# Patient Record
Sex: Male | Born: 1970 | Race: White | Hispanic: No | Marital: Married | Smoking: Never smoker
Health system: Southern US, Community
[De-identification: ages and names within clinical notes are randomized; demographics above are authoritative.]

## PROBLEM LIST (undated history)

## (undated) DIAGNOSIS — K219 Gastro-esophageal reflux disease without esophagitis: Secondary | ICD-10-CM

## (undated) HISTORY — DX: Gastro-esophageal reflux disease without esophagitis: K21.9

---

## 2000-06-25 ENCOUNTER — Emergency Department (HOSPITAL_COMMUNITY): Admission: EM | Admit: 2000-06-25 | Discharge: 2000-06-25 | Payer: Self-pay | Admitting: Emergency Medicine

## 2000-06-25 ENCOUNTER — Encounter: Payer: Self-pay | Admitting: Emergency Medicine

## 2003-02-16 ENCOUNTER — Emergency Department (HOSPITAL_COMMUNITY): Admission: EM | Admit: 2003-02-16 | Discharge: 2003-02-16 | Payer: Self-pay | Admitting: Emergency Medicine

## 2004-03-14 ENCOUNTER — Emergency Department (HOSPITAL_COMMUNITY): Admission: EM | Admit: 2004-03-14 | Discharge: 2004-03-14 | Payer: Self-pay | Admitting: Emergency Medicine

## 2004-06-13 ENCOUNTER — Emergency Department (HOSPITAL_COMMUNITY): Admission: EM | Admit: 2004-06-13 | Discharge: 2004-06-13 | Payer: Self-pay | Admitting: Emergency Medicine

## 2004-11-03 ENCOUNTER — Emergency Department (HOSPITAL_COMMUNITY): Admission: EM | Admit: 2004-11-03 | Discharge: 2004-11-04 | Payer: Self-pay | Admitting: Emergency Medicine

## 2017-06-26 DIAGNOSIS — J0100 Acute maxillary sinusitis, unspecified: Secondary | ICD-10-CM | POA: Diagnosis not present

## 2017-06-30 DIAGNOSIS — R0789 Other chest pain: Secondary | ICD-10-CM | POA: Diagnosis not present

## 2017-06-30 DIAGNOSIS — J060 Acute laryngopharyngitis: Secondary | ICD-10-CM | POA: Diagnosis not present

## 2017-06-30 DIAGNOSIS — R5383 Other fatigue: Secondary | ICD-10-CM | POA: Diagnosis not present

## 2017-09-30 DIAGNOSIS — R5383 Other fatigue: Secondary | ICD-10-CM | POA: Diagnosis not present

## 2017-09-30 DIAGNOSIS — E298 Other testicular dysfunction: Secondary | ICD-10-CM | POA: Diagnosis not present

## 2017-11-16 DIAGNOSIS — H8112 Benign paroxysmal vertigo, left ear: Secondary | ICD-10-CM | POA: Diagnosis not present

## 2017-11-16 DIAGNOSIS — R5383 Other fatigue: Secondary | ICD-10-CM | POA: Diagnosis not present

## 2017-12-20 ENCOUNTER — Emergency Department (HOSPITAL_COMMUNITY): Payer: BLUE CROSS/BLUE SHIELD

## 2017-12-20 ENCOUNTER — Emergency Department (HOSPITAL_COMMUNITY)
Admission: EM | Admit: 2017-12-20 | Discharge: 2017-12-20 | Disposition: A | Discharge status: Home / Self Care | Payer: BLUE CROSS/BLUE SHIELD | Attending: Emergency Medicine | Admitting: Emergency Medicine

## 2017-12-20 ENCOUNTER — Encounter (HOSPITAL_COMMUNITY): Payer: Self-pay | Admitting: Emergency Medicine

## 2017-12-20 DIAGNOSIS — R079 Chest pain, unspecified: Secondary | ICD-10-CM | POA: Diagnosis not present

## 2017-12-20 DIAGNOSIS — Z79899 Other long term (current) drug therapy: Secondary | ICD-10-CM | POA: Diagnosis not present

## 2017-12-20 DIAGNOSIS — R410 Disorientation, unspecified: Secondary | ICD-10-CM | POA: Diagnosis not present

## 2017-12-20 DIAGNOSIS — R42 Dizziness and giddiness: Secondary | ICD-10-CM

## 2017-12-20 DIAGNOSIS — R0602 Shortness of breath: Secondary | ICD-10-CM | POA: Diagnosis not present

## 2017-12-20 LAB — BASIC METABOLIC PANEL
Anion gap: 7 (ref 5–15)
BUN: 10 mg/dL (ref 6–20)
CHLORIDE: 103 mmol/L (ref 101–111)
CO2: 29 mmol/L (ref 22–32)
Calcium: 9.1 mg/dL (ref 8.9–10.3)
Creatinine, Ser: 0.97 mg/dL (ref 0.61–1.24)
GFR calc Af Amer: >60 mL/min (ref >60)
GFR calc non Af Amer: >60 mL/min (ref >60)
GLUCOSE: 112 mg/dL — AB (ref 65–99)
POTASSIUM: 4.3 mmol/L (ref 3.5–5.1)
Sodium: 139 mmol/L (ref 135–145)

## 2017-12-20 LAB — I-STAT TROPONIN, ED: Troponin i, poc: 0 ng/mL (ref 0.00–0.08)

## 2017-12-20 LAB — CBG MONITORING, ED: GLUCOSE-CAPILLARY: 104 mg/dL — AB (ref 65–99)

## 2017-12-20 LAB — DIFFERENTIAL
Abs Immature Granulocytes: 0 10*3/uL (ref 0.0–0.1)
BASOS ABS: 0.1 10*3/uL (ref 0.0–0.1)
BASOS PCT: 1 %
Eosinophils Absolute: 0.3 10*3/uL (ref 0.0–0.7)
Eosinophils Relative: 9 %
IMMATURE GRANULOCYTES: 0 %
Lymphocytes Relative: 33 %
Lymphs Abs: 1.3 10*3/uL (ref 0.7–4.0)
MONO ABS: 0.3 10*3/uL (ref 0.1–1.0)
Monocytes Relative: 8 %
NEUTROS ABS: 1.9 10*3/uL (ref 1.7–7.7)
NEUTROS PCT: 49 %

## 2017-12-20 LAB — APTT: aPTT: 28 seconds (ref 24–36)

## 2017-12-20 LAB — CBC
HEMATOCRIT: 43.3 % (ref 39.0–52.0)
Hemoglobin: 14.5 g/dL (ref 13.0–17.0)
MCH: 29.4 pg (ref 26.0–34.0)
MCHC: 33.5 g/dL (ref 30.0–36.0)
MCV: 87.7 fL (ref 78.0–100.0)
Platelets: 286 10*3/uL (ref 150–400)
RBC: 4.94 MIL/uL (ref 4.22–5.81)
RDW: 12.6 % (ref 11.5–15.5)
WBC: 3.9 10*3/uL — AB (ref 4.0–10.5)

## 2017-12-20 LAB — PROTIME-INR
INR: 1.03
Prothrombin Time: 13.4 seconds (ref 11.4–15.2)

## 2017-12-20 MED ORDER — LORAZEPAM 2 MG/ML IJ SOLN
1.0000 mg | Freq: Once | INTRAMUSCULAR | Status: AC
  Administered 2017-12-20: 1 mg via INTRAVENOUS
  Filled 2017-12-20: qty 1

## 2017-12-20 MED ORDER — GADOBENATE DIMEGLUMINE 529 MG/ML IV SOLN
17.0000 mL | Freq: Once | INTRAVENOUS | Status: AC
  Administered 2017-12-20: 17 mL via INTRAVENOUS

## 2017-12-20 MED ORDER — ACETAMINOPHEN 325 MG PO TABS
650.0000 mg | ORAL_TABLET | Freq: Once | ORAL | Status: AC
  Administered 2017-12-20: 650 mg via ORAL
  Filled 2017-12-20: qty 2

## 2017-12-20 NOTE — ED Triage Notes (Addendum)
Patient presents to ED for assessment of three weeks of dizziness, previous diagnosis of vertigo.  Starting two days ago he began to have midsternal chest pain with radiation to the right chest.  Patient c/o SOB, c/o intermittent nausea, c/o "feeling out of my head" and having trouble focusing.  Patient told to come to ED for TIA workup by his PCP.  Pt states increased thirst as well, hx of DM in his family.

## 2017-12-20 NOTE — ED Notes (Signed)
Patient transported to MRI 

## 2017-12-20 NOTE — ED Provider Notes (Signed)
Patient reports that a week ago he developed dizziness meaning sensation of room spinning which lasted approximately 4 days and resolved gradually.  Symptoms worse with changing positions.  He saw his primary care physician and was prescribed medicine which helped his dizziness.  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  He feels much improved presently.  He denies headache.  Denies speaking.  Denies other complaint.  On exam alert Glasgow Coma Score 15 HEENT exam no facial asymmetry neck is supple no bruit lungs clear to auscultation heart regular rate and rhythm.  Neurologic Glasgow Coma Score 15 cranial nerves II through XII grossly intact.  Gait normal motor strength 5/5 overall Romberg normal pronator drift normal finger-to-nose normal.  Discussed case with Dr.Linnzen, neurologist who does not feel that  symptoms suggest TIA, stroke or seizure rather transient confusional state.  He suggest MRI /MRA of brain and neck.  If okay patient can be discharged with follow-up to PCP   Doug SouJacubowitz, Blayke Pinera, MD 12/20/17 1649

## 2017-12-20 NOTE — ED Provider Notes (Signed)
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  He was disoriented for a few hours, but resolved and is back to baseline today.  Patient denies any dizziness, but he reports he was diagnosed with peripheral vertigo recently.  He does not feel like this was related.  Case was discussed with Dr. Otelia LimesLindzen with neurology who recommends MRI and discharge home to PCP, if negative.  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  Dr. Rosalia Hammersay discussed this with Dr. Amada JupiterKirkpatrick, neurologist, who advised this was fairly insignificant, but follow-up to PCP.  Patient complaining of headache after MRI, given Tylenol before discharge.  Return precautions discussed.  Patient and wife understand and agree with plan.  Patient vitals stable throughout ED course and discharged in satisfactory condition.   Emi HolesLaw, Shamieka Gullo M, PA-C 12/20/17 1942    Margarita Grizzleay, Danielle, MD 12/21/17 [phone]

## 2017-12-20 NOTE — ED Provider Notes (Signed)
[facility] EMERGENCY DEPARTMENT Provider Note   CSN: [id number] Arrival date & time: 12/20/17  1116     History   Chief Complaint Chief Complaint  Patient presents with  . Dizziness    HPI Brad Zuniga is a 47 y.o. male.  HPI   Brad Zuniga is a 47 year old male with no significant past medical history who presents to the emergency department for evaluation of transient episode of confusion yesterday.  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  This was atypical, as he has been operating heavy machinery for years. He felt uncoordinated, states that he was conscious the entire time but felt very foggy and out of it.  States that this feeling lasted for several hours, but has since improved.  He now feels baseline.  He denies focal numbness or weakness during this episode, denies difficulty speaking or facial droop.  He states that his bilateral hands were shaky.  No loss of bowel or bladder control or tongue biting.  He also reports that 3 weeks ago he felt the sensation of room spinning and went to his PCP who told him he had vertigo.  Those symptoms have resolved and he has not felt room spinning sensation in many days now.  He states that last week he had sharp intermittent right-sided chest pain which lasted for a day, that was 4 days ago and he has not had any chest pain since.  He reports that he sometimes has the feeling that it is difficult to take a deep breath, but yawning helps.  He currently denies fevers, chills, cough, chest pain, abdominal pain, nausea/vomiting, diaphoresis, lightheadedness, dizziness, syncope.  He is asking if he may have had a stroke.  History reviewed. No pertinent past medical history.  There are no active problems to display for this patient.   History reviewed. No pertinent surgical history.      Home  Medications    Prior to Admission medications   Medication Sig Start Date End Date Taking? Authorizing Provider  alum & mag hydroxide-simeth (MAALOX/MYLANTA) 200-200-20 MG/5ML suspension Take 30 mLs by mouth every 6 (six) hours as needed for indigestion or heartburn.   Yes [provider]  esomeprazole (NEXIUM) 40 MG capsule Take 40 mg by mouth daily at 12 noon.   Yes [provider]  ibuprofen (ADVIL,MOTRIN) 200 MG tablet Take 600 mg by mouth every 6 (six) hours as needed for mild pain.   Yes [provider]  Multiple Vitamins-Minerals (MULTIVITAMIN WITH MINERALS) tablet Take 1 tablet by mouth daily.   Yes [provider]    Family History History reviewed. No pertinent family history.  Social History Social History   Tobacco Use  . Smoking status: Never Smoker  . Smokeless tobacco: Never Used  Substance Use Topics  . Alcohol use: Not Currently  . Drug use: Never     Allergies   Patient has no known allergies.   Review of Systems Review of Systems  Constitutional: Negative for chills and fever.  HENT: Negative for congestion and sore throat.   Respiratory: Negative for shortness of breath (difficulty taking a full breath).   Cardiovascular: Negative for chest pain.  Gastrointestinal: Negative for abdominal pain, nausea and vomiting.  Genitourinary: Negative for difficulty urinating and frequency.  Musculoskeletal: Negative for back pain and gait problem.  Skin: Negative for  rash.  Neurological: Negative for dizziness (room spinning sensation a few weeks ago, denies this currently), seizures, syncope, speech difficulty, weakness, numbness and headaches.  Psychiatric/Behavioral: Positive for confusion (yesterday). Negative for agitation.     Physical Exam Updated Vital Signs BP 128/88   Pulse 63   Temp 98.1 F (36.7 C) (Oral)   Resp 17   SpO2 100%   Physical Exam  Constitutional: He is oriented to person, place, and time. He  appears well-developed and well-nourished. No distress.  Sitting at bedside in no apparent distress, nontoxic-appearing.  HENT:  Head: Normocephalic and atraumatic.  Mouth/Throat: Oropharynx is clear and moist. No oropharyngeal exudate.  Mucous memories moist.  Eyes: Pupils are equal, round, and reactive to light. Conjunctivae are normal. Right eye exhibits no discharge. Left eye exhibits no discharge.  Neck: Normal range of motion. Neck supple.  Cardiovascular: Normal rate, regular rhythm and intact distal pulses.  No murmur heard. Pulmonary/Chest: Effort normal and breath sounds normal. No stridor. No respiratory distress. He has no wheezes. He has no rales.  Abdominal: Soft. There is no tenderness.  Musculoskeletal: Normal range of motion.  Neurological: He is alert and oriented to person, place, and time. Coordination normal.  Mental Status:  Alert, oriented, thought content appropriate, able to give a coherent history. Speech fluent without evidence of aphasia. Able to follow 2 step commands without difficulty.  Cranial Nerves:  II:  Peripheral visual fields grossly normal, pupils equal, round, reactive to light III,IV, VI: ptosis not present, extra-ocular motions intact bilaterally  V,VII: smile symmetric, facial light touch sensation equal VIII: hearing grossly normal to voice  X: uvula elevates symmetrically  XI: bilateral shoulder shrug symmetric and strong XII: midline tongue extension without fassiculations Motor:  Normal tone. 5/5 in upper and lower extremities bilaterally including strong and equal grip strength and dorsiflexion/plantar flexion Sensory: Pinprick and light touch normal in all extremities.  Cerebellar: normal finger-to-nose with bilateral upper extremities Gait: normal gait and balance CV: distal pulses palpable throughout   Skin: Skin is warm and dry. He is not diaphoretic.  Psychiatric: He has a normal mood and affect. His behavior is normal.  Nursing  note and vitals reviewed.    ED Treatments / Results  Labs (all labs ordered are listed, but only abnormal results are displayed) Labs Reviewed  BASIC METABOLIC PANEL - Abnormal; Notable for the following components:      Result Value   Glucose, Bld 112 (*)    All other components within normal limits  CBC - Abnormal; Notable for the following components:   WBC 3.9 (*)    All other components within normal limits  CBG MONITORING, ED - Abnormal; Notable for the following components:   Glucose-Capillary 104 (*)    All other components within normal limits  PROTIME-INR  APTT  DIFFERENTIAL  I-STAT TROPONIN, ED    EKG EKG Interpretation  Date/Time:  Sunday December 20 2017 11:27:03 EDT Ventricular Rate:  80 PR Interval:  148 QRS Duration: 92 QT Interval:  394 QTC Calculation: 454 R Axis:   81 Text Interpretation:  Normal sinus rhythm Possible Left atrial enlargement Borderline ECG SINCE LAST TRACING HEART RATE HAS INCREASED Confirmed by Doug SouJacubowitz, Sam [phone]([id number]) on 12/20/2017 3:37:07 PM   Radiology Dg Chest 2 View  Result Date: 12/20/2017 CLINICAL DATA:  Shortness of breath and chest pain EXAM: CHEST - 2 VIEW COMPARISON:  March 02, 2009 FINDINGS: There is no edema or consolidation. The heart size and pulmonary vascularity are normal.  No adenopathy. No bone lesions. No pneumothorax. IMPRESSION: No edema or consolidation. Electronically Signed   By: Bretta Bang III M.D.   On: 12/20/2017 12:21   Ct Head Wo Contrast  Result Date: 12/20/2017 CLINICAL DATA:  47 year old male with abrupt onset vertigo and cognitive difficulty 3 weeks ago with persistent symptoms. EXAM: CT HEAD WITHOUT CONTRAST TECHNIQUE: Contiguous axial images were obtained from the base of the skull through the vertex without intravenous contrast. COMPARISON:  Orbit CT 07/21/2003. FINDINGS: Brain: Normal cerebral volume. No midline shift, ventriculomegaly, mass effect, evidence of mass lesion, intracranial hemorrhage  or evidence of cortically based acute infarction. Gray-white matter differentiation is within normal limits throughout the brain. Vascular: No suspicious intracranial vascular hyperdensity. Skull: Negative. Sinuses/Orbits: Minor paranasal sinus mucosal thickening. The bilateral tympanic cavities and mastoids are clear. Other: Visualized orbit soft tissues are within normal limits. Visualized scalp soft tissues are within normal limits. IMPRESSION: Normal noncontrast Head CT. Electronically Signed   By: Odessa Fleming M.D.   On: 12/20/2017 14:45    Procedures Procedures (including critical care time)  Medications Ordered in ED Medications  LORazepam (ATIVAN) injection 1 mg (has no administration in time range)     Initial Impression / Assessment and Plan / ED Course  I have reviewed the triage vital signs and the nursing notes.  Pertinent labs & imaging results that were available during my care of the patient were reviewed by me and considered in my medical decision making (see chart for details).    Presents to the emergency department for evaluation of transient episode of confusion yesterday.  No loss of consciousness, tongue biting or loss of bowel or bladder control.  He has no prior history of seizures.  No focal numbness, weakness or speech difficulty.  On exam today, no neurological deficits.  Reports that he feels baseline.  Extensive work-up including labs, CT scan and cardiac work-up ordered prior to me seeing this patient.  Radiology results reviewed.  CBC unremarkable.  BMP without any major electrolyte abnormalities, kidney function within normal limits.  I-STAT troponin negative and EKG nonischemic with patient reporting he has not had chest pain in four days, doubt ACS.  CT head without acute abnormality.  Chest x-ray without acute abnormality.  Discussed this patient with Neurologist Dr. Otelia Limes who doubts TIA or atypical seizure.  Recommend MRI/MRA brain for further evaluation.  If  negative patient can be discharged home with instructions to follow-up with his PCP.  This was a shared visit with Dr. Rennis Chris who also saw the patient and agrees with this plan.  Signout given at shift change to PA Glenford Bayley who will disposition the patient once MRI returns.  Final Clinical Impressions(s) / ED Diagnoses   Final diagnoses:  None    ED Discharge Orders    None       Kellie Shropshire, PA-C 12/20/17 1700    Doug Sou, MD 12/20/17 [phone]

## 2017-12-20 NOTE — Discharge Instructions (Addendum)
Please follow-up with your doctor for recheck this week.  The radiologist recommended a repeat CTA of the circle of Willis to reassess the minute aneurysm found on MRI.  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  Please let your doctor know about this however.  Please return the emergency department if you develop any new or worsening symptoms.

## 2017-12-23 DIAGNOSIS — R5383 Other fatigue: Secondary | ICD-10-CM | POA: Diagnosis not present

## 2017-12-23 DIAGNOSIS — R42 Dizziness and giddiness: Secondary | ICD-10-CM | POA: Diagnosis not present

## 2018-01-01 DIAGNOSIS — G459 Transient cerebral ischemic attack, unspecified: Secondary | ICD-10-CM | POA: Diagnosis not present

## 2018-01-01 DIAGNOSIS — I517 Cardiomegaly: Secondary | ICD-10-CM | POA: Diagnosis not present

## 2018-01-01 DIAGNOSIS — I6523 Occlusion and stenosis of bilateral carotid arteries: Secondary | ICD-10-CM | POA: Diagnosis not present

## 2018-01-01 DIAGNOSIS — R42 Dizziness and giddiness: Secondary | ICD-10-CM | POA: Diagnosis not present

## 2018-01-12 DIAGNOSIS — A770 Spotted fever due to Rickettsia rickettsii: Secondary | ICD-10-CM | POA: Insufficient documentation

## 2018-01-12 DIAGNOSIS — R42 Dizziness and giddiness: Secondary | ICD-10-CM | POA: Insufficient documentation

## 2018-01-12 DIAGNOSIS — I671 Cerebral aneurysm, nonruptured: Secondary | ICD-10-CM | POA: Insufficient documentation

## 2018-01-12 DIAGNOSIS — R51 Headache: Secondary | ICD-10-CM | POA: Diagnosis not present

## 2018-01-12 DIAGNOSIS — R519 Headache, unspecified: Secondary | ICD-10-CM | POA: Insufficient documentation

## 2018-01-25 ENCOUNTER — Encounter: Payer: Self-pay | Admitting: Neurology

## 2018-01-27 ENCOUNTER — Ambulatory Visit (INDEPENDENT_AMBULATORY_CARE_PROVIDER_SITE_OTHER): Payer: BLUE CROSS/BLUE SHIELD | Admitting: Neurology

## 2018-01-27 ENCOUNTER — Encounter: Payer: Self-pay | Admitting: Neurology

## 2018-01-27 VITALS — BP 102/80 | HR 54 | Ht 74.0 in | Wt 188.0 lb

## 2018-01-27 DIAGNOSIS — G454 Transient global amnesia: Secondary | ICD-10-CM | POA: Diagnosis not present

## 2018-01-27 DIAGNOSIS — H8110 Benign paroxysmal vertigo, unspecified ear: Secondary | ICD-10-CM | POA: Diagnosis not present

## 2018-01-27 DIAGNOSIS — I671 Cerebral aneurysm, nonruptured: Secondary | ICD-10-CM | POA: Diagnosis not present

## 2018-01-27 NOTE — Patient Instructions (Signed)
Although it is not a classic presentation, you may have had transient global amnesia.  It is benign.  Just to be sure, I want to check an EEG to look for any abnormalities that suggest increased risk for seizures.  The aneurysm is small.  Recommend repeat imaging (CTA of head) in one year.  This can be followed up by your PCP.  If you experience new headaches or double vision, then imaging may need to be performed sooner.  No follow up needed unless the EEG shows something concerning or if you experience new or recurrent symptoms.

## 2018-01-27 NOTE — Progress Notes (Signed)
NEUROLOGY CONSULTATION NOTE  Brad SaupeGary Gilberti MRN: [id number] DOB: 07-13-1971  Referring provider: Gerre PebblesSally Davis, PA-C Primary care provider: Gerre PebblesSally Davis, PA-C  Reason for consult:  Dizziness, confusion  HISTORY OF PRESENT ILLNESS: Brad Zuniga is a 47 year old right-handed male who presents for dizziness and transient episode of confusion.  History supplemented by ED and referring provider's notes.  In early May, he woke up one morning with vertigo, described as room spinning.  They spinning was positional, occurring when laying down or turning his head quickly.  It would last up to a couple of minutes.  He was fine if he stayed still.  He was diagnosed with BPPV.    On 12/19/17 he had an episode of transient confusion.  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  He didn't know which buttons to press or how to move the joystick.  This was concerning because he has used this machine for years.  He also did not feel well.  He felt shaky and had cold sweats.  He needed to call somebody else to finish the job.  He felt ill for about 2 hours before symptoms resolved.  He was not amnestic to the events of that morning.  He did not have any trouble recognizing familiar faces.  He did not have any associated headache, facial droop, slurred speech, visual disturbance, bowel or bladder incontinence or unilateral numbness or weakness.  At the time, he was not overworked or dehydrated.  He was not performing and strenuous activity.  He felt relaxed.  He went to the ED the following day for further evaluation.  CT of head was normal.  MRI of brain with and without contrast was normal.  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  MRA of neck was normal.  Labs, including CBC and BMP, did not reveal infection or electrolyte abnormalities.  Troponin was negative.  EKG unremarkable and showed no acute abnormality.  His PCP reportedly checked Lyme and RMSF.   Lyme was negative.  He said that he tested positive for RMSF and was prescribed doxycycline.  He denies rash or fever.  He has never had a similar event in the past.  Vertigo has subsided.  He feels well.  PAST MEDICAL HISTORY: Remote history of Lyme disease  PAST SURGICAL HISTORY: No past surgical history on file.  MEDICATIONS: Current Outpatient Medications on File Prior to Visit  Medication Sig Dispense Refill  . alum & mag hydroxide-simeth (MAALOX/MYLANTA) 200-200-20 MG/5ML suspension Take 30 mLs by mouth every 6 (six) hours as needed for indigestion or heartburn.    . esomeprazole (NEXIUM) 40 MG capsule Take 40 mg by mouth daily at 12 noon.    Marland Kitchen. ibuprofen (ADVIL,MOTRIN) 200 MG tablet Take 600 mg by mouth every 6 (six) hours as needed for mild pain.    . Multiple Vitamins-Minerals (MULTIVITAMIN WITH MINERALS) tablet Take 1 tablet by mouth daily.     No current facility-administered medications on file prior to visit.     ALLERGIES: No Known Allergies  FAMILY HISTORY: Family History  Problem Relation Age of Onset  . Heart attack Father   . Diabetes Father   . Hypertension Father   . Deep vein thrombosis Father   . Breast cancer Sister   . Heart defect Sister   . Thyroid disease Sister   . Lung cancer Maternal Grandmother   . Lung cancer Maternal Grandfather   . Heart attack  Paternal Grandmother   . Lung cancer Paternal Grandfather     SOCIAL HISTORY: Social History   Socioeconomic History  . Marital status: Married    Spouse name: Not on file  . Number of children: 2  . Years of education: Not on file  . 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 Financial resource strain: Not on file  . Food insecurity:    Worry: Not on file    Inability: Not on file  . Transportation needs:    Medical: Not on file    Non-medical: Not on file  Tobacco  Use  . Smoking status: Never Smoker  . Smokeless tobacco: Current User    Types: Snuff  Substance and Sexual Activity  . Alcohol use: Not Currently  . Drug use: Never  . Sexual activity: Not on file  Lifestyle  . Physical activity:    Days per week: Not on file    Minutes per session: Not on file  . Stress: Not on file  Relationships  . Social connections:    Talks on phone: Not on file    Gets together: Not on file    Attends religious service: Not on file    Active member of club or organization: Not on file    Attends meetings of clubs or organizations: Not on file    Relationship status: Not on file  . Intimate partner violence:    Fear of current or ex partner: Not on file    Emotionally abused: Not on file    Physically abused: Not on file    Forced sexual activity: Not on file  Other Topics Concern  . Not on file  Social History Narrative   Patient is right-handed. He lives with his wife and one of his daughters in a 2 story house. He rarely drinks caffeine now, ghe is very active at work.    REVIEW OF SYSTEMS: Constitutional: No fevers, chills, or sweats, no generalized fatigue, change in appetite Eyes: No visual changes, double vision, eye pain Ear, nose and throat: No hearing loss, ear pain, nasal congestion, sore throat Cardiovascular: No chest pain, palpitations Respiratory:  No shortness of breath at rest or with exertion, wheezes GastrointestinaI: No nausea, vomiting, diarrhea, abdominal pain, fecal incontinence Genitourinary:  No dysuria, urinary retention or frequency Musculoskeletal:  No neck pain, back pain Integumentary: No rash, pruritus, skin lesions Neurological: as above Psychiatric: No depression, insomnia, anxiety Endocrine: No palpitations, fatigue, diaphoresis, mood swings, change in appetite, change in weight, increased thirst Hematologic/Lymphatic:  No purpura, petechiae. Allergic/Immunologic: no itchy/runny eyes, nasal congestion, recent  allergic reactions, rashes  PHYSICAL EXAM: Vitals:   01/27/18 0815  BP: 102/80  Pulse: (!) 54  SpO2: 96%   General: No acute distress.  Patient appears well-groomed.  Head:  Normocephalic/atraumatic Eyes:  fundi examined but not visualized Neck: supple, no paraspinal tenderness, full range of motion Back: No paraspinal tenderness Heart: regular rate and rhythm Lungs: Clear to auscultation bilaterally. Vascular: No carotid bruits. Neurological Exam: Mental status: alert and oriented to person, place, and time, recent and remote memory intact, fund of knowledge intact, attention and concentration intact, speech fluent and not dysarthric, language intact. Cranial nerves: CN I: not tested CN II: pupils equal, round and reactive to light, visual fields intact CN III, IV, VI:  full range of motion, no nystagmus, no ptosis CN V: facial sensation intact CN  VII: upper and lower face symmetric CN VIII: hearing intact CN IX, X: gag intact, uvula midline CN XI: sternocleidomastoid and trapezius muscles intact CN XII: tongue midline Bulk & Tone: normal, no fasciculations. Motor:  5/5 throughout  Sensation: temperature and vibration sensation intact. Deep Tendon Reflexes:  2+ throughout, toes downgoing.  Finger to nose testing:  Without dysmetria.  Heel to shin:  Without dysmetria.  Gait:  Normal station and stride.  Able to turn and tandem walk. Romberg negative.  IMPRESSION: 1.  Transient confusional state.  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  TGA typically affects antegrade memory and procedural memory is intact.  However, I have no other explanation. 2.  Benign paroxysmal positional vertigo.  Likely unrelated 3.  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  Incidental finding 4.  He reports that he tested positive for [facility] Spotted Fever and was treated with doxycycline.  I don't think it is related to the  transient confusion.  PLAN: 1.  We will check EEG.  If EEG unremarkable, then no further testing needed. 2.  Recommend follow up imaging of cerebral aneurysm in one year.  Recommend CTA of head.  This can be performed by PCP.  If he develops new headaches or double vision, then sooner imaging may be required. 3.  Follow up pending EEG results or if he has any new or recurrent symptoms.  Otherwise, follow up as needed.   Thank you for allowing me to take part in the care of this patient.  Shon Millet, DO  CC: Gerre Pebbles, PA-C

## 2018-02-01 ENCOUNTER — Ambulatory Visit (INDEPENDENT_AMBULATORY_CARE_PROVIDER_SITE_OTHER): Payer: BLUE CROSS/BLUE SHIELD | Admitting: Neurology

## 2018-02-01 DIAGNOSIS — G454 Transient global amnesia: Secondary | ICD-10-CM

## 2018-02-02 NOTE — Procedures (Signed)
ELECTROENCEPHALOGRAM REPORT  Date of Study: 02/01/2018  Patient's Name: Noni SaupeGary Darrough MRN: [id number] Date of Birth: 1971-04-24  Clinical History: 47 year old male with transient episode of memory loss  Medications: MAALOX/MYLANTA 200-200-20MG /5ML suspension  NEXIUM 40 MG capsule   ADVIL,MOTRIN 200 MG tablet  MULTIVITAMIN WITH MINERALS   Technical Summary: A multichannel digital EEG recording measured by the international 10-20 system with electrodes applied with paste and impedances below 5000 ohms performed in our laboratory with EKG monitoring in an awake and drowsy patient.  Hyperventilation and photic stimulation were performed.  The digital EEG was referentially recorded, reformatted, and digitally filtered in a variety of bipolar and referential montages for optimal display.    Description: The patient is awake and drowsy during the recording.  During maximal wakefulness, there is a symmetric, medium voltage 11 Hz posterior dominant rhythm that attenuates with eye opening.  The record is symmetric.  During drowsiness and sleep, there is an increase in theta slowing of the background with shifting asymmetry of the bilateral temporal lobes.  Stage 2 sleep is briefly seen.  Hyperventilation and photic stimulation did not elicit any abnormalities.  There were no epileptiform discharges or electrographic seizures seen.    EKG lead was unremarkable.  Impression: This awake and asleep EEG is within normal limits.    Clinical Correlation: A normal EEG does not exclude a clinical diagnosis of epilepsy.  If further clinical questions remain, prolonged EEG may be helpful.  Clinical correlation is advised.   Shon MilletAdam Jaffe, DO

## 2018-02-04 ENCOUNTER — Telehealth: Payer: Self-pay

## 2018-02-04 NOTE — Telephone Encounter (Signed)
Called and spoke with Pt, advised him of EEG results

## 2018-02-04 NOTE — Telephone Encounter (Signed)
-----   Message from Drema DallasAdam R Jaffe, DO sent at 02/02/2018 12:55 PM EDT ----- EEG is normal

## 2018-03-25 ENCOUNTER — Encounter

## 2018-03-25 ENCOUNTER — Ambulatory Visit: Payer: BLUE CROSS/BLUE SHIELD | Admitting: Neurology

## 2018-05-21 ENCOUNTER — Telehealth: Payer: Self-pay | Admitting: Neurology

## 2018-05-21 NOTE — Telephone Encounter (Signed)
Patient is calling in wanting to know if his appt on 11/13 for F/U is needed. He said that it was discussed earlier that depending on his results from EEG he may not need appt. Please call him at [phone]. Thanks!

## 2018-05-21 NOTE — Telephone Encounter (Signed)
Called Pt and advised to call us if anything changes, but is not necessary to keep appt.

## 2018-05-26 ENCOUNTER — Ambulatory Visit: Payer: BLUE CROSS/BLUE SHIELD | Admitting: Neurology

## 2018-07-23 DIAGNOSIS — J018 Other acute sinusitis: Secondary | ICD-10-CM | POA: Diagnosis not present

## 2019-05-11 IMAGING — MR MR MRA HEAD W/O CM
13 of 19 series · 25 of 48 positions shown · IV contrast (multihance)
Comparison: CT head without contrast 12/20/2017.

CLINICAL DATA: Confusion, acute, unexplained. Dizziness with room
spinning 1 week ago lasting for 4 days. This has since resolved.
Sudden onset of confusion yesterday which has partially resolved.
Possible TIA.

EXAM:
MRI HEAD WITHOUT AND WITH CONTRAST
MRA HEAD WITHOUT CONTRAST
MRA NECK WITHOUT AND WITH CONTRAST
TECHNIQUE: Multiplanar, multiecho pulse sequences of the brain and surrounding
structures were obtained without and with intravenous contrast.
Angiographic images of the Circle of Willis were obtained using MRA
technique without intravenous contrast. Angiographic images of the
neck were obtained using MRA technique without and with intravenous
contrast. Carotid stenosis measurements (when applicable) are
obtained utilizing NASCET criteria, using the distal internal
carotid diameter as the denominator.
CONTRAST:  17mL MULTIHANCE GADOBENATE DIMEGLUMINE 529 MG/ML IV SOLN

[Series 3: DWI · axial · 3.0mm · 0.94mm/px · z∈[-80,+81]mm · 4 of 114 slices shown (1 of 2)]
[im 1/114]
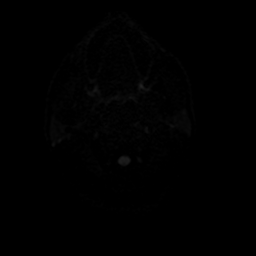
[im 38/114]
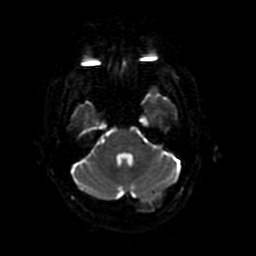
[im 76/114]
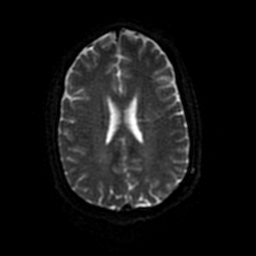
[im 114/114]
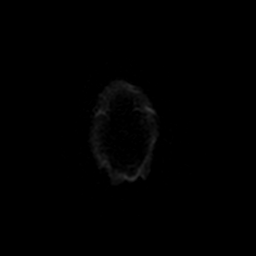

[Series 4: ax (id) 2 · axial · 1.0mm · 0.47mm/px · z∈[-50,+37]mm · 6 of 184 slices shown]
[im 1/184]
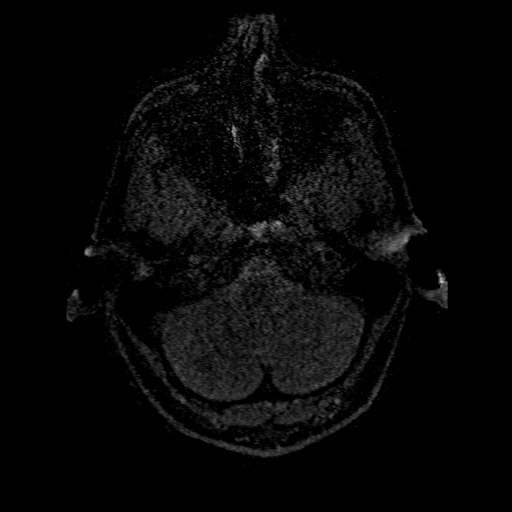
[im 37/184]
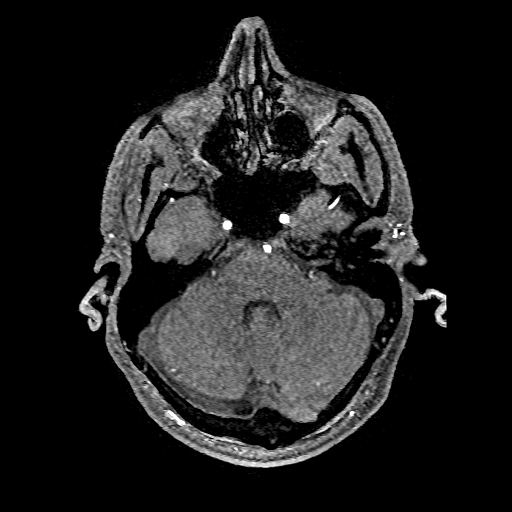
[im 74/184]
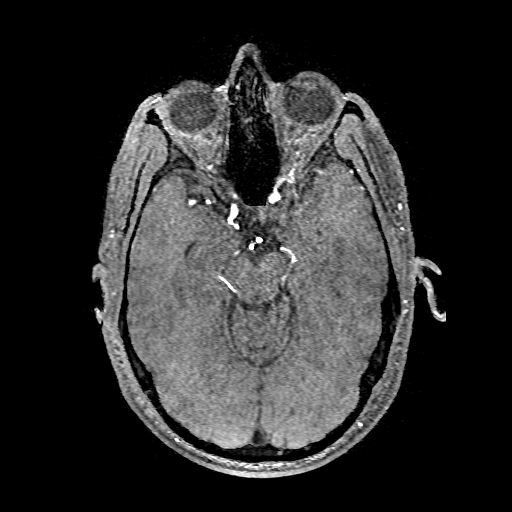
[im 110/184]
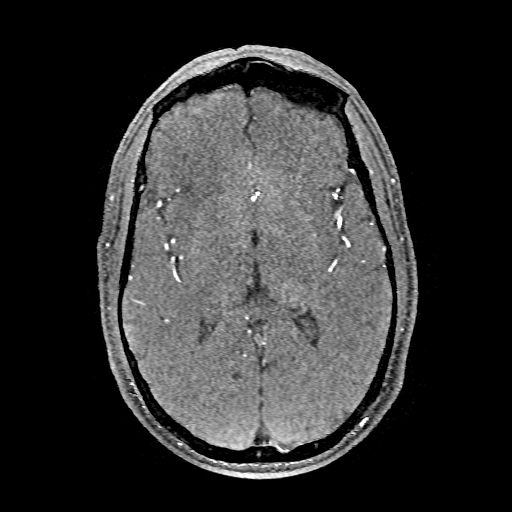
[im 147/184]
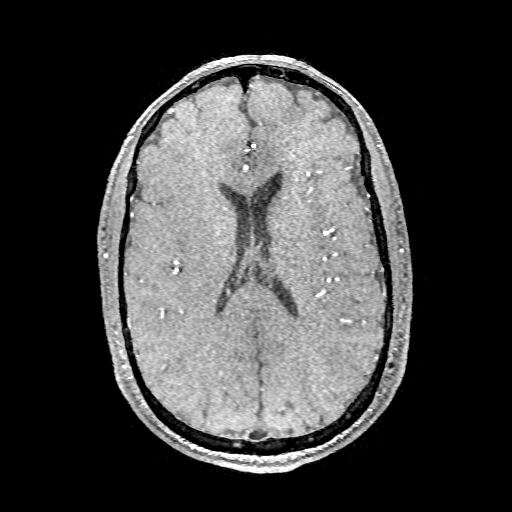
[im 184/184]
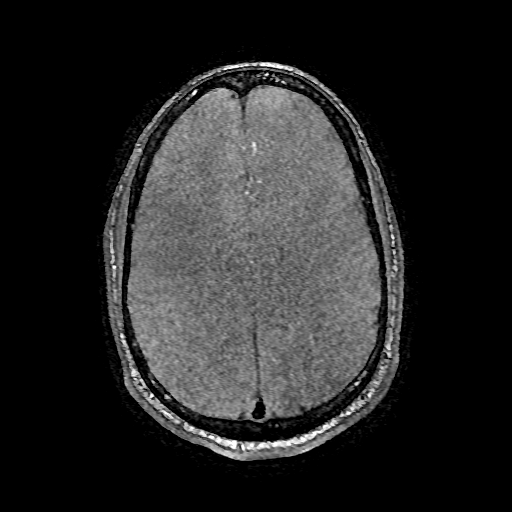

[Series 5: DWI · coronal · 4.0mm · 0.94mm/px · 2 of 72 slices shown (2 of 2)]
[im 1/72]
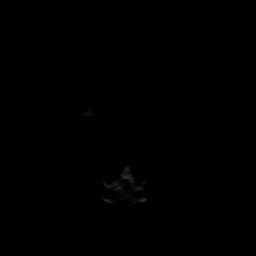
[im 72/72]
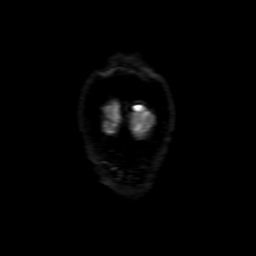

[Series 6: FLAIR · sagittal · 5.0mm · 0.47mm/px · 1 of 23 slices shown (1 of 2)]
[im 1/23]
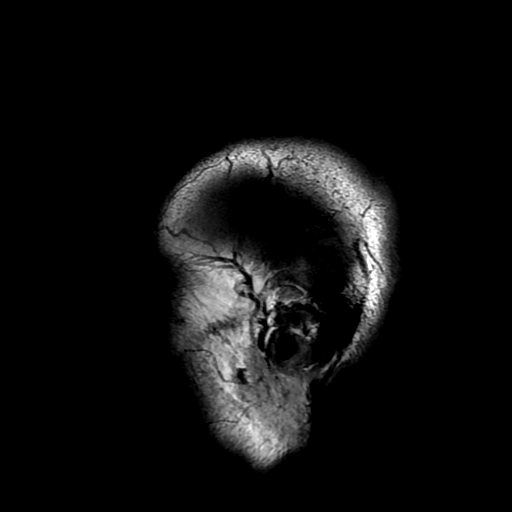

[Series 10: T2 · axial · 5.0mm · 0.47mm/px · 1 of 29 slices shown]
[im 1/29]
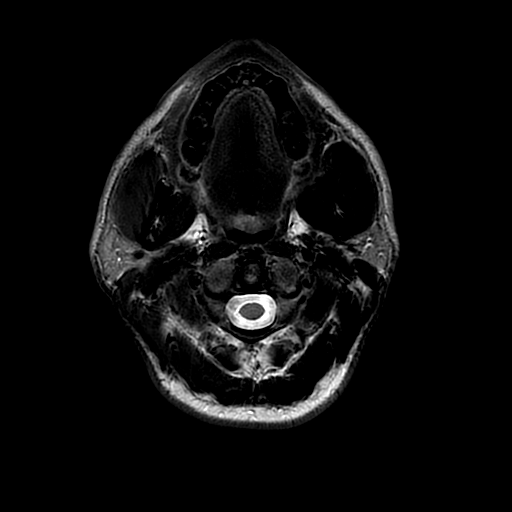

[Series 11: (person_name) · axial · 3.0mm · 0.47mm/px · z∈[-64,+80]mm · 3 of 100 slices shown]
[im 1/100]
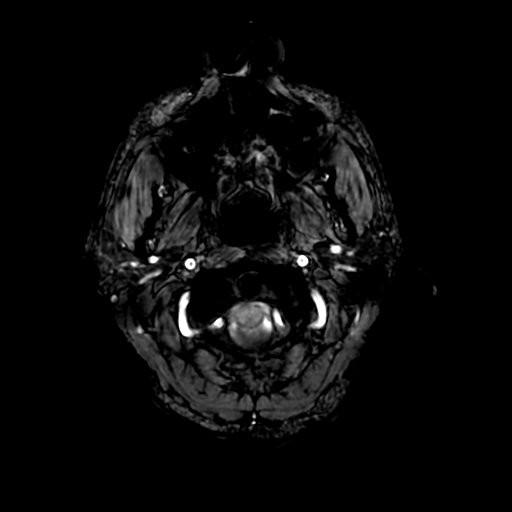
[im 50/100]
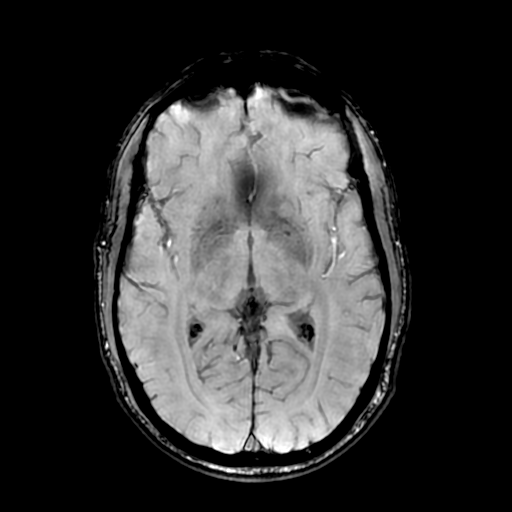
[im 100/100]
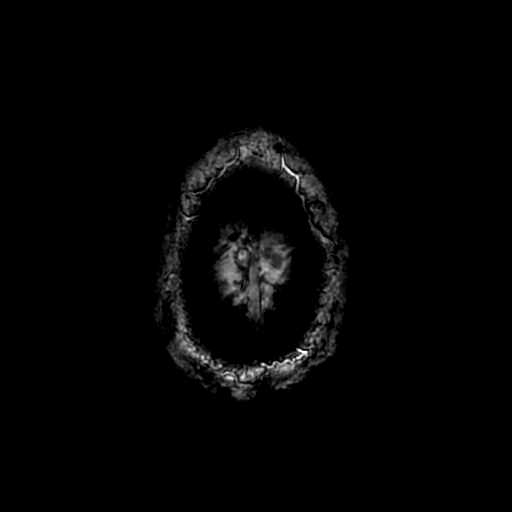

[Series 14: FLAIR · axial · 3.0mm · 0.45mm/px · 1 of 28 slices shown (2 of 2)]
[im 1/28]
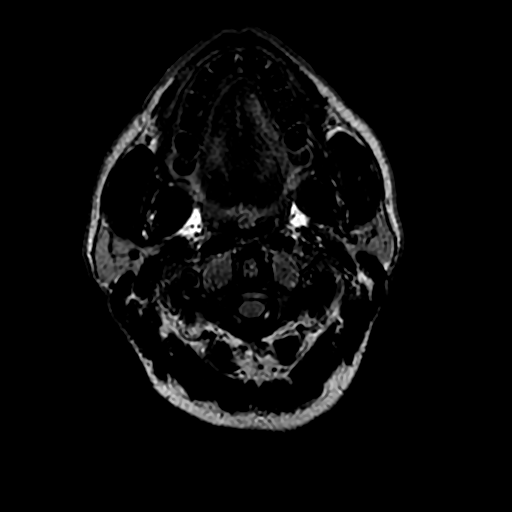

[Series 15: ax 3(person_name) · axial · 3.0mm · 0.94mm/px · 1 of 48 slices shown]
[im 1/48]
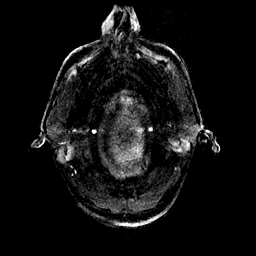

[Series 17: T2 post-contrast · coronal · 5.0mm · 0.39mm/px · 1 of 27 slices shown]
[im 1/27]
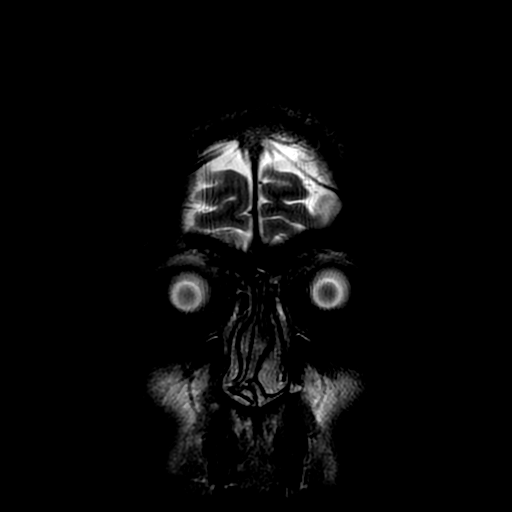

[Series 18: ax 3(person_name) +c · axial · 3.0mm · 0.94mm/px · 1 of 48 slices shown]
[im 1/48]
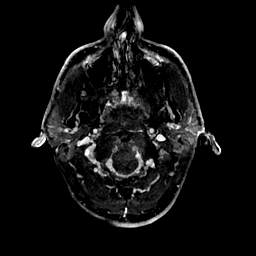

[Series 19: T1 · coronal · 5.0mm · 0.39mm/px · 1 of 27 slices shown]
[im 1/27]
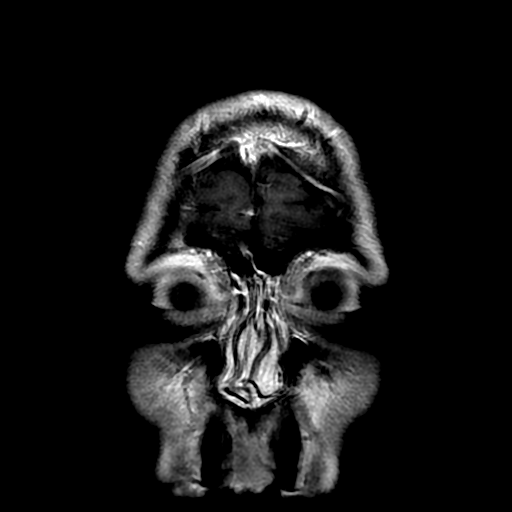

[Series 350: ADC · axial · 3.0mm · 0.94mm/px · z∈[-80,+81]mm · 2 of 55 slices shown (1 of 2)]
[im 1/55]
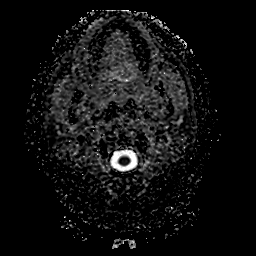
[im 55/55]
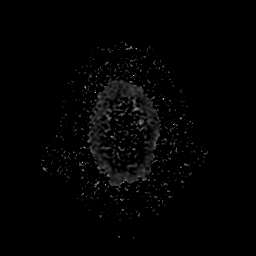

[Series 550: ADC · coronal · 4.0mm · 0.94mm/px · 1 of 36 slices shown (2 of 2)]
[im 1/36]
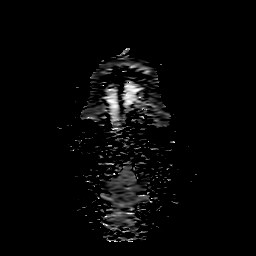

[25 of 48 positions shown; findings below may reference images not displayed]

FINDINGS: MRI HEAD FINDINGS

Brain: The diffusion-weighted images demonstrate no acute or
subacute infarction. No significant white matter disease is present.
No acute infarct, hemorrhage, or mass lesion is present. The
ventricles are of normal size. The internal auditory canals are
within normal limits bilaterally. The brainstem and cerebellum are
normal.

The postcontrast images demonstrate no pathologic enhancement.

Vascular: Flow is present in the major intracranial arteries.

Skull and upper cervical spine: The skull base is within normal
limits. Craniocervical junction is normal.

Sinuses/Orbits: Mild mucosal thickening is present within posterior
ethmoid air cells and the maxillary sinuses bilaterally, left
greater than right. Globes and orbits are within normal limits.

MRA HEAD FINDINGS

A 1 mm aneurysm versus infundibulum is present at the left posterior
communicating artery. The internal carotid arteries are otherwise
within normal limits through the ICA termini bilaterally. The A1 and
M1 segments are normal. The anterior communicating artery is patent.
MCA bifurcations are normal. ACA and MCA branch vessels are within
normal limits bilaterally.

Vertebrobasilar junction is normal. Both posterior cerebral arteries
originate from basilar tip. A prominent right posterior
communicating artery contributes. PCA branch vessels are within
normal limits bilaterally.

MRA NECK FINDINGS

Time-of-flight images demonstrate no significant flow disturbance at
either carotid bifurcation. Flow is antegrade in the vertebral
arteries bilaterally.

There is a common origin of the left common carotid artery and the
innominate artery, a normal variant.

The right common carotid artery is within normal limits. Bifurcation
is unremarkable. Cervical right ICA is normal.

The left common carotid artery is within normal limits. Bifurcation
is within normal limits. The cervical left ICA is normal.

The vertebral arteries originate from the subclavian arteries
bilaterally without focal stenosis. The vertebral arteries are
codominant. There is no significant stenosis of either vertebral
artery in the neck or through the vertebrobasilar junction.
IMPRESSION: 1. Normal MRI of the brain. No acute or focal lesion to account for
dizziness or TIA.
2. 1 mm aneurysm versus infundibulum of the left posterior
communicating artery. Recommend 1 year follow-up with CTA
circle-of-Willis.
3. Otherwise normal MRA circle-of-Willis.
4. Negative MRA of the neck.  No focal stenosis or vascular disease.

## 2019-05-11 IMAGING — CR DG CHEST 2V
2 series · 2 of 2 positions shown · non-contrast
Comparison: March 02, 2009

CLINICAL DATA: Shortness of breath and chest pain

EXAM:
CHEST - 2 VIEW

[chest pa]
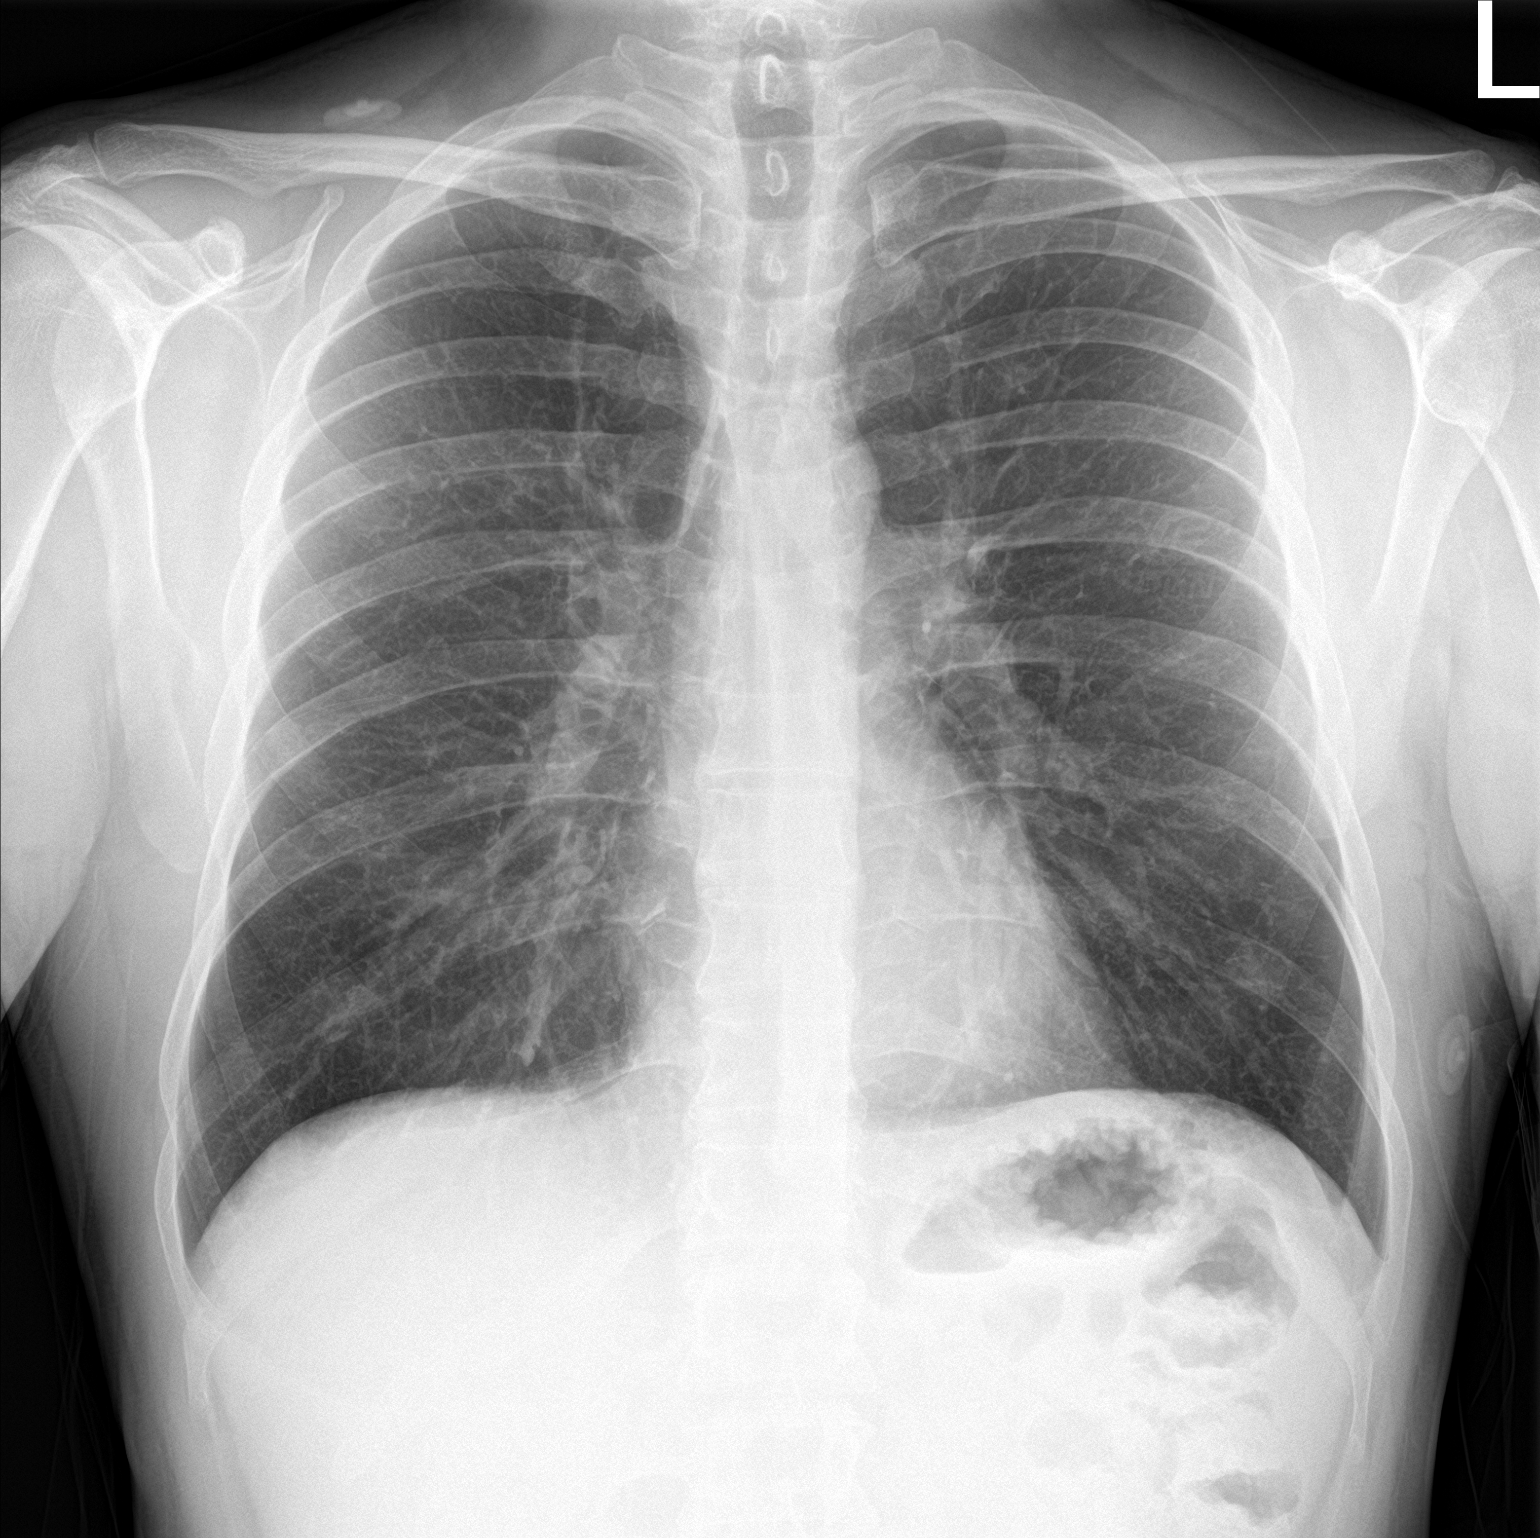

[chest lat]
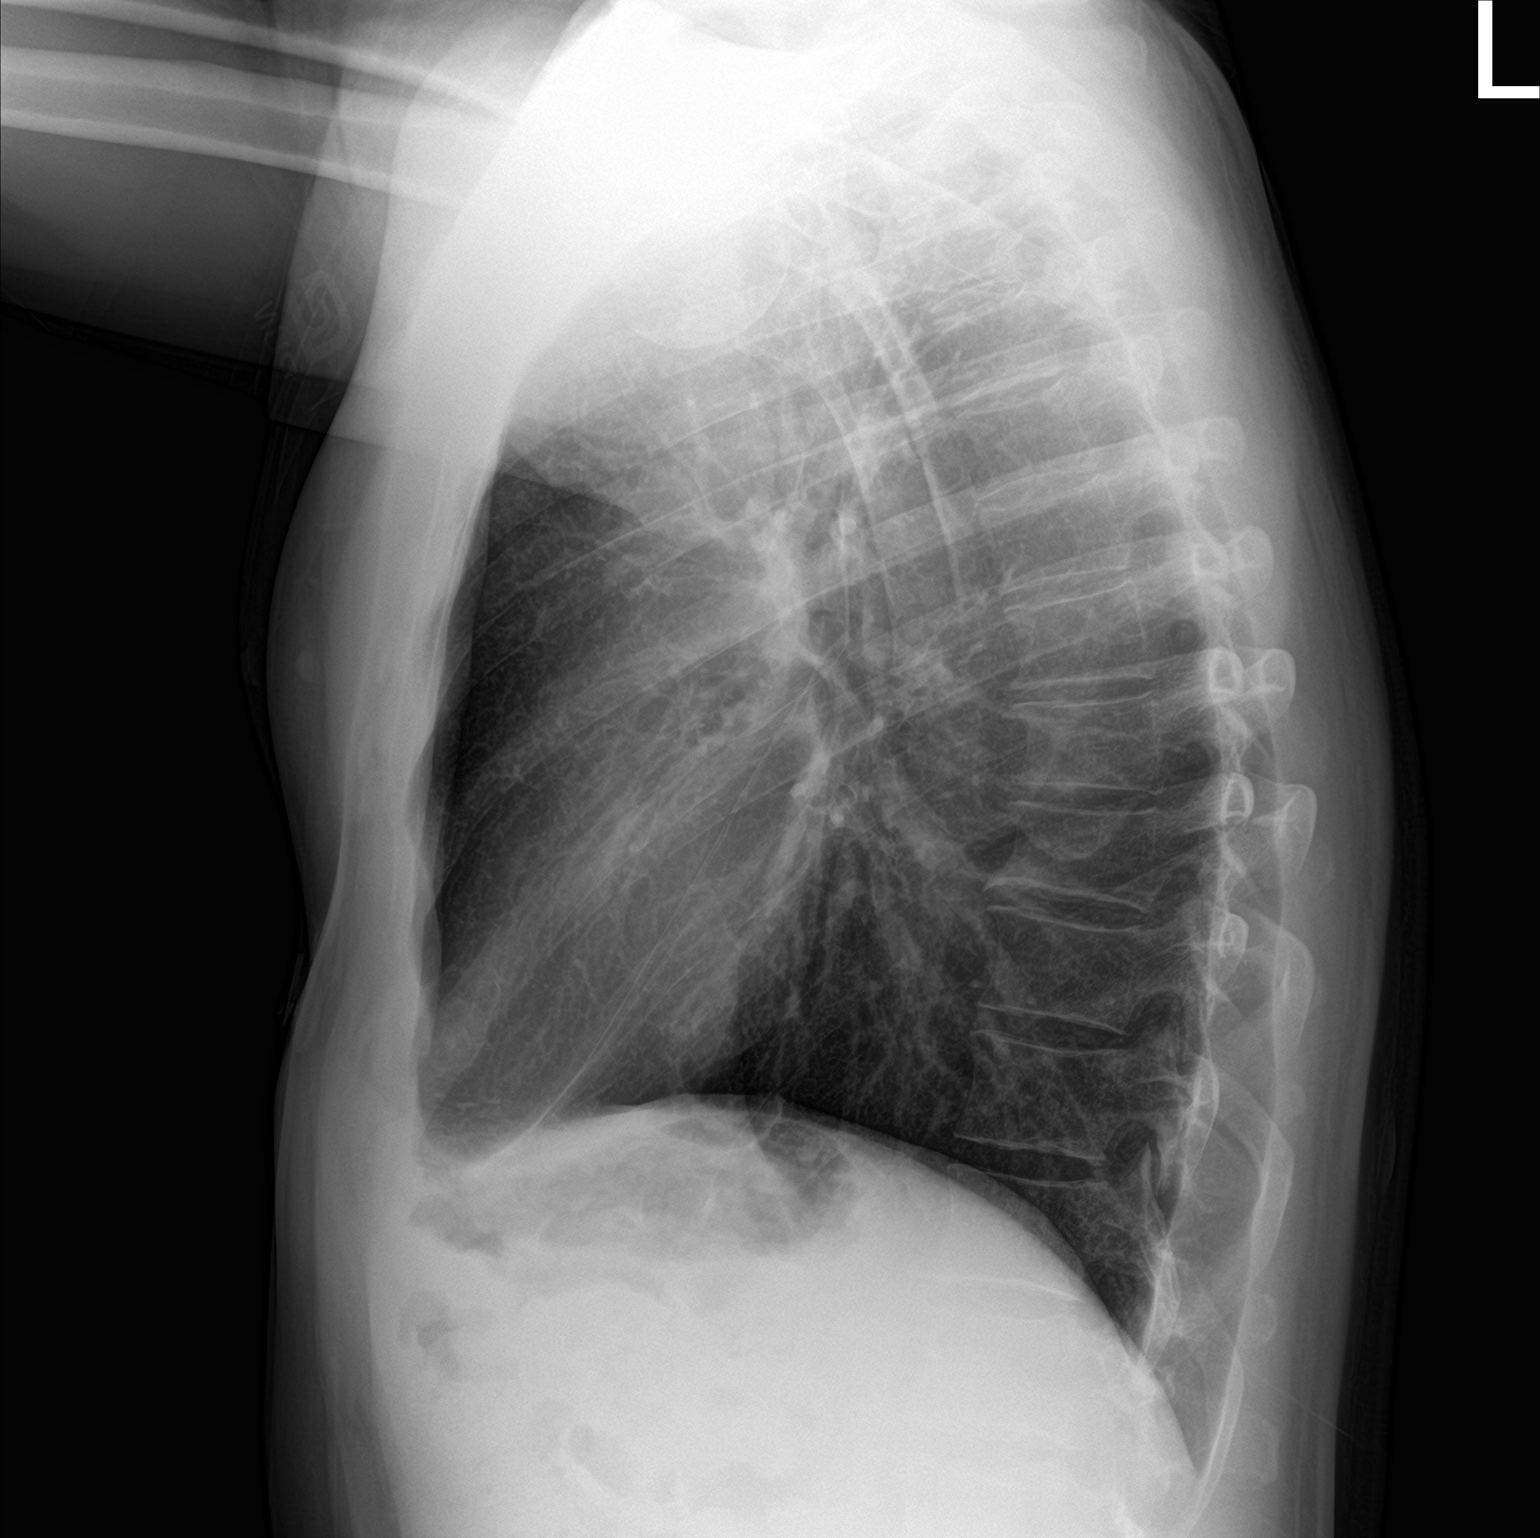

[2 of 2 positions shown; findings below may reference images not displayed]

FINDINGS: There is no edema or consolidation. The heart size and pulmonary
vascularity are normal. No adenopathy. No bone lesions. No
pneumothorax.
IMPRESSION: No edema or consolidation.

## 2019-09-12 ENCOUNTER — Telehealth (INDEPENDENT_AMBULATORY_CARE_PROVIDER_SITE_OTHER): Payer: 59 | Admitting: Nurse Practitioner

## 2019-09-12 ENCOUNTER — Encounter: Payer: Self-pay | Admitting: Nurse Practitioner

## 2019-09-12 VITALS — Ht 72.75 in | Wt 195.0 lb

## 2019-09-12 DIAGNOSIS — R0789 Other chest pain: Secondary | ICD-10-CM | POA: Insufficient documentation

## 2019-09-12 DIAGNOSIS — R110 Nausea: Secondary | ICD-10-CM | POA: Diagnosis not present

## 2019-09-12 LAB — POC COVID19 BINAXNOW: SARS Coronavirus 2 Ag: NEGATIVE

## 2019-09-12 NOTE — Assessment & Plan Note (Signed)
Patient refused nausea medication.

## 2019-09-12 NOTE — Progress Notes (Signed)
TELEHEALTH VIRTUAL VISIT ENCOUNTER NOTE  I connected with Brad Zuniga on 09/12/19 at  2:00 PM EST by telephone at home and verified that I am speaking with the correct person using two identifiers.   I discussed the limitations, risks, security and privacy concerns of performing an evaluation and management service by telephone and the availability of in person appointments. I also discussed with the patient that there may be a patient responsible charge related to this service. The patient expressed understanding and agreed to proceed.   History:  Brad Zuniga is a 49 y.o. male being evaluated today for  Chest tightness, stomach cramps, congestion and fatigue.Chest Pain  This is a new problem. The onset quality is gradual. The problem occurs intermittently. The problem has been unchanged. The pain is mild. The quality of the pain is described as tightness. The pain does not radiate. Associated symptoms include dizziness, headaches, nausea and weakness. Pertinent negatives include no abdominal pain, cough or shortness of breath. He has tried nothing for the symptoms. The treatment provided no relief.  Dizziness Associated symptoms include chest pain, congestion, headaches, nausea and weakness. Pertinent negatives include no abdominal pain, chills, coughing, myalgias, rash or sore throat.       Past Medical History:  Diagnosis Date  . GERD (gastroesophageal reflux disease)    History reviewed. No pertinent surgical history. The following portions of the patient's history were reviewed and updated as appropriate: allergies, current medications, past family history, past medical history, past social history, past surgical history and problem list.     Review of Systems:   Review of Systems  Constitutional: Negative for chills.  HENT: Positive for congestion. Negative for ear pain, sinus pain and sore throat.   Eyes: Negative for blurred vision, double vision and pain.  Respiratory: Negative for  cough and shortness of breath.   Cardiovascular: Positive for chest pain.       Chest tighness  Gastrointestinal: Positive for constipation and nausea. Negative for abdominal pain.  Musculoskeletal: Negative for myalgias.  Skin: Negative for rash.  Neurological: Positive for dizziness, tingling, weakness and headaches.   Physical Exam:   General:  Alert, oriented and cooperative.   Mental Status: Normal mood and affect perceived. Normal judgment and thought content.  Physical exam deferred due to nature of the encounter    Assessment and Plan:     1. Chest tightness - POC COVID-19 BinaxNow  2. Nausea  - POC COVID-19 BinaxNow       I discussed the assessment and treatment plan with the patient. The patient was provided an opportunity to ask questions and all were answered. The patient agreed with the plan and demonstrated an understanding of the instructions.   The patient was advised to call back or seek an in-person evaluation/go to the ED if the symptoms worsen or if the condition fails to improve as anticipated.  I provided 10 minutes of non-face-to-face time during this encounter.   Daryll Drown, NP

## 2019-09-12 NOTE — Assessment & Plan Note (Signed)
covid-19 test ordered, patient knows to go to ED with continues chest tightness, unrelenting pain and increase shortness of breath

## 2019-09-12 NOTE — Patient Instructions (Signed)
Patient is presenting with chest tightness, facial tingling, congestion, and fatigue in the last 4 days. NP  educated patient on signs and symptoms of stroke. Patient advised to go to the Emergency department. Covid-19 testing is also recommended due to congestion, nausea and fatigue. Patient wants to wait against medical advice for a few more days since His current symptoms have lasted more than 4 days. Nausea, Adult Nausea is feeling sick to your stomach or feeling that you are about to throw up (vomit). Feeling sick to your stomach is usually not serious, but it may be an early sign of a more serious medical problem. As you feel sicker to your stomach, you may throw up. If you throw up, or if you are not able to drink enough fluids, there is a risk that you may lose too much water in your body (get dehydrated). If you lose too much water in your body, you may:  Feel tired.  Feel thirsty.  Have a dry mouth.  Have cracked lips.  Go pee (urinate) less often. Older adults and people who have other diseases or a weak body defense system (immune system) have a higher risk of losing too much water in the body. The main goals of treating this condition are:  To relieve your nausea.  To ensure your nausea occurs less often.  To prevent throwing up and losing too much fluid. Follow these instructions at home: Watch your symptoms for any changes. Tell your doctor about them. Follow these instructions as told by your doctor. Eating and drinking      Take an ORS (oral rehydration solution). This is a drink that is sold at pharmacies and stores.  Drink clear fluids in small amounts as you are able. These include: ? Water. ? Ice chips. ? Fruit juice that has water added (diluted fruit juice). ? Low-calorie sports drinks.  Eat bland, easy-to-digest foods in small amounts as you are able, such as: ? Bananas. ? Applesauce. ? Rice. ? Low-fat (lean) meats. ? Toast. ? Crackers.  Avoid  drinking fluids that have a lot of sugar or caffeine in them. This includes energy drinks, sports drinks, and soda.  Avoid alcohol.  Avoid spicy or fatty foods. General instructions  Take over-the-counter and prescription medicines only as told by your doctor.  Rest at home while you get better.  Drink enough fluid to keep your pee (urine) pale yellow.  Take slow and deep breaths when you feel sick to your stomach.  Avoid food or things that have strong smells.  Wash your hands often with soap and water. If you cannot use soap and water, use hand sanitizer.  Make sure that all people in your home wash their hands well and often.  Keep all follow-up visits as told by your doctor. This is important. Contact a doctor if:  You feel sicker to your stomach.  You feel sick to your stomach for more than 2 days.  You throw up.  You are not able to drink fluids without throwing up.  You have new symptoms.  You have a fever.  You have a headache.  You have muscle cramps.  You have a rash.  You have pain while peeing.  You feel light-headed or dizzy. Get help right away if:  You have pain in your chest, neck, arm, or jaw.  You feel very weak or you pass out (faint).  You have throw up that is bright red or looks like coffee grounds.  You  have bloody or black poop (stools) or poop that looks like tar.  You have a very bad headache, a stiff neck, or both.  You have very bad pain, cramping, or bloating in your belly (abdomen).  You have trouble breathing or you are breathing very quickly.  Your heart is beating very quickly.  Your skin feels cold and clammy.  You feel confused.  You have signs of losing too much water in your body, such as: ? Dark pee, very little pee, or no pee. ? Cracked lips. ? Dry mouth. ? Sunken eyes. ? Sleepiness. ? Weakness. These symptoms may be an emergency. Do not wait to see if the symptoms will go away. Get medical help right  away. Call your local emergency services (911 in the [place] Do not drive yourself to the hospital. Summary  Nausea is feeling sick to your stomach or feeling that you are about to throw up (vomit).  If you throw up, or if you are not able to drink enough fluids, there is a risk that you may lose too much water in your body (get dehydrated).  Eat and drink what your doctor tells you. Take over-the-counter and prescription medicines only as told by your doctor.  Contact a doctor right away if your symptoms get worse or you have new symptoms.  Keep all follow-up visits as told by your doctor. This is important. This information is not intended to replace advice given to you by your health care provider. Make sure you discuss any questions you have with your health care provider. Document Revised: 12/08/2017 Document Reviewed: 12/08/2017 Elsevier Patient Education  [employer].     Fatigue If you have fatigue, you feel tired all the time and have a lack of energy or a lack of motivation. Fatigue may make it difficult to start or complete tasks because of exhaustion. In general, occasional or mild fatigue is often a normal response to activity or life. However, long-lasting (chronic) or extreme fatigue may be a symptom of a medical condition. Follow these instructions at home: General instructions  Watch your fatigue for any changes.  Go to bed and get up at the same time every day.  Avoid fatigue by pacing yourself during the day and getting enough sleep at night.  Maintain a healthy weight. Medicines  Take over-the-counter and prescription medicines only as told by your health care provider.  Take a multivitamin, if told by your health care provider.  Do not use herbal or dietary supplements unless they are approved by your health care provider. Activity   Exercise regularly, as told by your health care provider.  Use or practice techniques to help you relax, such as  yoga, tai chi, meditation, or massage therapy. Eating and drinking   Avoid heavy meals in the evening.  Eat a well-balanced diet, which includes lean proteins, whole grains, plenty of fruits and vegetables, and low-fat dairy products.  Avoid consuming too much caffeine.  Avoid the use of alcohol.  Drink enough fluid to keep your urine pale yellow. Lifestyle  Change situations that cause you stress. Try to keep your work and personal schedule in balance.  Do not use any products that contain nicotine or tobacco, such as cigarettes and e-cigarettes. If you need help quitting, ask your health care provider.  Do not use drugs. Contact a health care provider if:  Your fatigue does not get better.  You have a fever.  You suddenly lose or gain weight.  You  have headaches.  You have trouble falling asleep or sleeping through the night.  You feel angry, guilty, anxious, or sad.  You are unable to have a bowel movement (constipation).  Your skin is dry.  You have swelling in your legs or another part of your body. Get help right away if:  You feel confused.  Your vision is blurry.  You feel faint or you pass out.  You have a severe headache.  You have severe pain in your abdomen, your back, or the area between your waist and hips (pelvis).  You have chest pain, shortness of breath, or an irregular or fast heartbeat.  You are unable to urinate, or you urinate less than normal.  You have abnormal bleeding, such as bleeding from the rectum, vagina, nose, lungs, or nipples.  You vomit blood.  You have thoughts about hurting yourself or others. If you ever feel like you may hurt yourself or others, or have thoughts about taking your own life, get help right away. You can go to your nearest emergency department or call:  Your local emergency services (911 in the [place]  A suicide crisis helpline, such as the [facility] at (539)536-2702. This  is open 24 hours a day. Summary  If you have fatigue, you feel tired all the time and have a lack of energy or a lack of motivation.  Fatigue may make it difficult to start or complete tasks because of exhaustion.  Long-lasting (chronic) or extreme fatigue may be a symptom of a medical condition.  Exercise regularly, as told by your health care provider.  Change situations that cause you stress. Try to keep your work and personal schedule in balance. This information is not intended to replace advice given to you by your health care provider. Make sure you discuss any questions you have with your health care provider. Document Revised: 01/19/2019 Document Reviewed: 03/25/2017 Elsevier Patient Education  2020 [employer].

## 2019-09-13 ENCOUNTER — Encounter: Payer: Self-pay | Admitting: Physician Assistant

## 2019-09-14 LAB — NOVEL CORONAVIRUS, NAA: SARS-CoV-2, NAA: NOT DETECTED

## 2019-09-15 ENCOUNTER — Encounter: Payer: Self-pay | Admitting: Physician Assistant

## 2019-09-15 ENCOUNTER — Other Ambulatory Visit: Payer: Self-pay

## 2019-09-15 ENCOUNTER — Ambulatory Visit: Payer: 59 | Admitting: Physician Assistant

## 2019-09-15 VITALS — BP 118/80 | HR 73 | Temp 97.7°F | Ht 74.0 in | Wt 203.8 lb

## 2019-09-15 DIAGNOSIS — J060 Acute laryngopharyngitis: Secondary | ICD-10-CM | POA: Diagnosis not present

## 2019-09-15 DIAGNOSIS — R1084 Generalized abdominal pain: Secondary | ICD-10-CM | POA: Diagnosis not present

## 2019-09-15 DIAGNOSIS — K625 Hemorrhage of anus and rectum: Secondary | ICD-10-CM

## 2019-09-15 MED ORDER — AMOXICILLIN 875 MG PO TABS
875.0000 mg | ORAL_TABLET | Freq: Two times a day (BID) | ORAL | 0 refills | Status: DC
Start: 2019-09-15 — End: 2020-07-23

## 2019-09-15 NOTE — Assessment & Plan Note (Signed)
Referral to Dr Misenheimer 

## 2019-09-15 NOTE — Progress Notes (Signed)
Established Patient Office Visit  Subjective:  Patient ID: Brad Zuniga, male    DOB: 18-Nov-1970  Age: 49 y.o. MRN: [id number]  CC:  Chief Complaint  Patient presents with  . Rectal Bleeding    HPI Brad Zuniga presents for abdominal pain, rectal bleeding and uri  Pt had a televisit a few days ago with uri type symptoms but also some chest tightness with breathing.  He did have a COVID test which was negative and then advised to go to ED for further evaluation of chest pain - He had a normal EKG, troponin and chest xray - he continues to have mild URI symptoms and has had no further chest discomfort - he actually states it felt like he could not get a good deep breath and since starting Claritin qd that has resolved that symptom  While he was in ED however his main presenting symptom was rectal bleeding.  He states that for the past 3 weeks he has had bright red bleeding and while seen in ED hemoccults were positive .  He has had some mild abdominal discomfort as well especially epigastric.  He does take otc Nexium which helps with those symptoms - He did have abdominal CT in ED which was essentially normal  Past Medical History:  Diagnosis Date  . GERD (gastroesophageal reflux disease)     History reviewed. No pertinent surgical history.  Family History  Problem Relation Age of Onset  . Heart attack Father   . Diabetes Father   . Hypertension Father   . Deep vein thrombosis Father   . Breast cancer Sister   . Heart defect Sister   . Thyroid disease Sister   . Lung cancer Maternal Grandmother   . Lung cancer Maternal Grandfather   . Heart attack Paternal Grandmother   . Lung cancer Paternal Grandfather     Social History   Socioeconomic History  . Marital status: Married    Spouse name: Not on file  . Number of children: 2  . Years of education: Not on file  . Highest education level: Associate degree: occupational, Scientist, product/process development, or vocational program  Occupational History   . 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 Smoking status: Never Smoker  . Smokeless tobacco: Current User    Types: Snuff  Substance and Sexual Activity  . Alcohol use: Not Currently  . Drug use: Never  . Sexual activity: Not on file  Other Topics Concern  . Not on file  Social History Narrative   Patient is right-handed. He lives with his wife and one of his daughters in a 2 story house. He rarely drinks caffeine now, ghe is very active at work.   Social Determinants of Health   Financial Resource Strain:   . Difficulty of Paying Living Expenses: Not on file  Food Insecurity:   . Worried About Programme researcher, broadcasting/film/video in the Last Year: Not on file  . Ran Out of Food in the Last Year: Not on file  Transportation Needs:   . Lack of Transportation (Medical): Not on file  . Lack of Transportation (Non-Medical): Not on file  Physical Activity:   . Days of Exercise per Week: Not on file  . Minutes of Exercise per Session: Not on file  Stress:   . Feeling of Stress : Not on file  Social Connections:   . Frequency of Communication with Friends and Family: Not on file  . Frequency  of Social Gatherings with Friends and Family: Not on file  . Attends Religious Services: Not on file  . Active Member of Clubs or Organizations: Not on file  . Attends Archivist Meetings: Not on file  . Marital Status: Not on file  Intimate Partner Violence:   . Fear of Current or Ex-Partner: Not on file  . Emotionally Abused: Not on file  . Physically Abused: Not on file  . Sexually Abused: Not on file     Current Outpatient Medications:  .  esomeprazole (NEXIUM) 40 MG capsule, Take 40 mg by mouth daily at 12 noon., Disp: , Rfl:  .  loratadine (CLARITIN) 10 MG tablet, Take 10 mg by mouth daily., Disp: , Rfl:  .  amoxicillin (AMOXIL) 875 MG tablet, Take 1 tablet (875 mg total) by mouth 2 (two) times daily., Disp: 20 tablet, Rfl: 0   No Known  Allergies  ROS CONSTITUTIONAL: Negative for chills, fatigue, fever, unintentional weight gain and unintentional weight loss.  E/N/T: mild congestion, PND and scratchy throat CARDIOVASCULAR: Negative for chest pain, dizziness, palpitations and pedal edema.  RESPIRATORY: Negative for recent cough and dyspnea.  GASTROINTESTINAL:see HPI GU - negative MSK: Negative for arthralgias and myalgias.  INTEGUMENTARY: Negative for rash.  NEUROLOGICAL: Negative for dizziness and headaches.  PSYCHIATRIC: Negative for sleep disturbance and to question depression screen.  Negative for depression, negative for anhedonia.        Objective:    PHYSICAL EXAM:   VS: BP 118/80   Pulse 73   Temp 97.7 F (36.5 C)   Ht 6\' 2"  (1.88 m)   Wt 203 lb 12.8 oz (92.4 kg)   SpO2 98%   BMI 26.17 kg/m   GEN: Well nourished, well developed, in no acute distress  HEENT: normal external ears and nose - normal external auditory canals and TMS - hearing grossly normal - normal nasal mucosa and septum - Lips, Teeth and Gums - normal  Oropharynx - erythema and PND Neck: no JVD or masses - no thyromegaly Cardiac: RRR; no murmurs, rubs, or gallops,no edema - no significant varicosities Respiratory:  normal respiratory rate and pattern with no distress - mild scattered rhonchi which clears with cough GI: normal bowel sounds, no masses or tenderness MS: no deformity or atrophy  Skin: warm and dry, no rash  Neuro:  Alert and Oriented x 3, Strength and sensation are intact - CN II-Xii grossly intact Psych: euthymic mood, appropriate affect and demeanor  BP 118/80   Pulse 73   Temp 97.7 F (36.5 C)   Ht 6\' 2"  (1.88 m)   Wt 203 lb 12.8 oz (92.4 kg)   SpO2 98%   BMI 26.17 kg/m  Wt Readings from Last 3 Encounters:  09/15/19 203 lb 12.8 oz (92.4 kg)  09/12/19 195 lb (88.5 kg)  01/27/18 188 lb (85.3 kg)     Health Maintenance Due  Topic Date Due  . HIV Screening  05/25/1985  . TETANUS/TDAP  05/25/1989  .  INFLUENZA VACCINE  02/12/2019    There are no preventive care reminders to display for this patient.  No results found for: TSH Lab Results  Component Value Date   WBC 3.9 (L) 12/20/2017   HGB 14.5 12/20/2017   HCT 43.3 12/20/2017   MCV 87.7 12/20/2017   PLT 286 12/20/2017   Lab Results  Component Value Date   NA 139 12/20/2017   K 4.3 12/20/2017   CO2 29 12/20/2017   GLUCOSE 112 (H)  12/20/2017   BUN 10 12/20/2017   CREATININE 0.97 12/20/2017   CALCIUM 9.1 12/20/2017   ANIONGAP 7 12/20/2017   No results found for: CHOL No results found for: HDL No results found for: LDLCALC No results found for: TRIG No results found for: CHOLHDL No results found for: ZTIW5Y    Assessment & Plan:   Problem List Items Addressed This Visit    None    Visit Diagnoses    Generalized abdominal pain    -  Primary   Relevant Orders   CBC with Differential/Platelet   Comprehensive metabolic panel   H Pylori, IGM, IGG, IGA AB   Ambulatory referral to Gastroenterology   Rectal bleeding       Relevant Orders   Ambulatory referral to Gastroenterology      Meds ordered this encounter  Medications  . amoxicillin (AMOXIL) 875 MG tablet    Sig: Take 1 tablet (875 mg total) by mouth 2 (two) times daily.    Dispense:  20 tablet    Refill:  0    Order Specific Question:   Supervising Provider    Answer:   Corey Harold    Follow-up: Return if symptoms worsen or fail to improve.    SARA R Cloy Cozzens, PA-C

## 2019-09-15 NOTE — Assessment & Plan Note (Signed)
otc decongestants as directed rx amoxil

## 2019-09-15 NOTE — Assessment & Plan Note (Signed)
labwork pending Follow up with GI

## 2019-09-16 LAB — COMPREHENSIVE METABOLIC PANEL
ALT: 13 IU/L (ref 0–44)
AST: 15 IU/L (ref 0–40)
Albumin/Globulin Ratio: 1.7 (ref 1.2–2.2)
Albumin: 4.5 g/dL (ref 4.0–5.0)
Alkaline Phosphatase: 69 IU/L (ref 39–117)
BUN/Creatinine Ratio: 8 — ABNORMAL LOW (ref 9–20)
BUN: 10 mg/dL (ref 6–24)
Bilirubin Total: 0.7 mg/dL (ref 0.0–1.2)
CO2: 25 mmol/L (ref 20–29)
Calcium: 9.7 mg/dL (ref 8.7–10.2)
Chloride: 101 mmol/L (ref 96–106)
Creatinine, Ser: 1.22 mg/dL (ref 0.76–1.27)
GFR calc Af Amer: 80 mL/min/{1.73_m2} (ref >59)
GFR calc non Af Amer: 69 mL/min/{1.73_m2} (ref >59)
Globulin, Total: 2.7 g/dL (ref 1.5–4.5)
Glucose: 99 mg/dL (ref 65–99)
Potassium: 4.3 mmol/L (ref 3.5–5.2)
Sodium: 141 mmol/L (ref 134–144)
Total Protein: 7.2 g/dL (ref 6.0–8.5)

## 2019-09-16 LAB — CBC WITH DIFFERENTIAL/PLATELET
Basophils Absolute: 0 10*3/uL (ref 0.0–0.2)
Basos: 1 %
EOS (ABSOLUTE): 0.2 10*3/uL (ref 0.0–0.4)
Eos: 5 %
Hematocrit: 43.7 % (ref 37.5–51.0)
Hemoglobin: 14.5 g/dL (ref 13.0–17.7)
Immature Grans (Abs): 0 10*3/uL (ref 0.0–0.1)
Immature Granulocytes: 0 %
Lymphocytes Absolute: 1.2 10*3/uL (ref 0.7–3.1)
Lymphs: 28 %
MCH: 30.1 pg (ref 26.6–33.0)
MCHC: 33.2 g/dL (ref 31.5–35.7)
MCV: 91 fL (ref 79–97)
Monocytes Absolute: 0.3 10*3/uL (ref 0.1–0.9)
Monocytes: 8 %
Neutrophils Absolute: 2.5 10*3/uL (ref 1.4–7.0)
Neutrophils: 58 %
Platelets: 258 10*3/uL (ref 150–450)
RBC: 4.82 x10E6/uL (ref 4.14–5.80)
RDW: 12.4 % (ref 11.6–15.4)
WBC: 4.3 10*3/uL (ref 3.4–10.8)

## 2019-09-16 LAB — H PYLORI, IGM, IGG, IGA AB
H pylori, IgM Abs: <9 units (ref 0.0–8.9)
H. pylori, IgA Abs: 14.6 units — ABNORMAL HIGH (ref 0.0–8.9)
H. pylori, IgG AbS: 0.25 Index Value (ref 0.00–0.79)

## 2019-09-19 ENCOUNTER — Other Ambulatory Visit: Payer: Self-pay | Admitting: Physician Assistant

## 2019-09-19 MED ORDER — AMOXICILLIN 875 MG PO TABS: 875.0000 mg | ORAL_TABLET | Freq: Two times a day (BID) | ORAL | 0 refills | Status: DC

## 2019-09-19 MED ORDER — CLARITHROMYCIN 500 MG PO TABS: 500.0000 mg | ORAL_TABLET | Freq: Two times a day (BID) | ORAL | 0 refills | Status: DC

## 2019-10-03 ENCOUNTER — Other Ambulatory Visit: Payer: Self-pay | Admitting: Physician Assistant

## 2019-10-03 DIAGNOSIS — Z20828 Contact with and (suspected) exposure to other viral communicable diseases: Secondary | ICD-10-CM

## 2019-10-14 ENCOUNTER — Ambulatory Visit (INDEPENDENT_AMBULATORY_CARE_PROVIDER_SITE_OTHER): Payer: 59

## 2019-10-14 DIAGNOSIS — Z01812 Encounter for preprocedural laboratory examination: Secondary | ICD-10-CM | POA: Diagnosis not present

## 2019-10-14 DIAGNOSIS — Z20828 Contact with and (suspected) exposure to other viral communicable diseases: Secondary | ICD-10-CM

## 2019-10-14 NOTE — Progress Notes (Signed)
Patient Name: Yonis Carreon Date of Birth: [birth year] MRN:  [id number]  Rhydian Baldi is a 49 y.o. yo male presenting for COVID-19 testing.  He is being tested from the vehicle.  Jeury Mcnab is being tested due to an upcoming colonoscopy.  He denies symptoms of COVID-19 and has no known positive exposures in the past 14 days.    Jacklynn Bue, LPN 64:33 AM

## 2019-10-15 LAB — SARS-COV-2, NAA 2 DAY TAT

## 2019-10-15 LAB — NOVEL CORONAVIRUS, NAA: SARS-CoV-2, NAA: NOT DETECTED

## 2019-10-21 ENCOUNTER — Encounter: Payer: Self-pay | Admitting: Physician Assistant

## 2020-03-27 ENCOUNTER — Other Ambulatory Visit: Payer: Self-pay

## 2020-03-27 ENCOUNTER — Ambulatory Visit (INDEPENDENT_AMBULATORY_CARE_PROVIDER_SITE_OTHER): Payer: 59

## 2020-03-27 DIAGNOSIS — Z1152 Encounter for screening for COVID-19: Secondary | ICD-10-CM

## 2020-03-27 LAB — POC COVID19 BINAXNOW: SARS Coronavirus 2 Ag: NEGATIVE

## 2020-03-27 NOTE — Progress Notes (Signed)
Patient Name: Brad Zuniga Date of Birth: [birth year] MRN:  [id number]  Brad Zuniga is a 49 y.o. yo male presenting for COVID-19 testing.    Brad Zuniga is being tested due to a requirement for a function.  His test resulted NEGATIVE.  Brad Bue, LPN 1:75 PM

## 2020-07-23 ENCOUNTER — Telehealth (INDEPENDENT_AMBULATORY_CARE_PROVIDER_SITE_OTHER): Payer: BC Managed Care – PPO | Admitting: Family Medicine

## 2020-07-23 ENCOUNTER — Encounter: Payer: Self-pay | Admitting: Family Medicine

## 2020-07-23 VITALS — BP 138/82 | HR 81 | Ht 74.0 in | Wt 200.0 lb

## 2020-07-23 DIAGNOSIS — R5383 Other fatigue: Secondary | ICD-10-CM

## 2020-07-23 DIAGNOSIS — Z20822 Contact with and (suspected) exposure to covid-19: Secondary | ICD-10-CM

## 2020-07-23 DIAGNOSIS — R059 Cough, unspecified: Secondary | ICD-10-CM | POA: Diagnosis not present

## 2020-07-23 LAB — POC COVID19 BINAXNOW: SARS Coronavirus 2 Ag: NEGATIVE

## 2020-07-23 LAB — POCT INFLUENZA A/B
Influenza A, POC: NEGATIVE
Influenza B, POC: NEGATIVE

## 2020-07-23 MED ORDER — ALBUTEROL SULFATE HFA 108 (90 BASE) MCG/ACT IN AERS: 2.0000 | INHALATION_SPRAY | Freq: Four times a day (QID) | RESPIRATORY_TRACT | 0 refills | Status: DC | PRN

## 2020-07-23 NOTE — Progress Notes (Signed)
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 No mouth or nose protection. Symptoms started Friday.  Pt was taking dayquil but stopped taking because of nexium. Has been using ibuprofen. Did home rapid covid Friday, negative result.   Complains of nasal congestion, fatigue, cough, SOB, chest tightness, chest pain when lying, headache.   no covid vaccines, no flu shot.  Virtual Visit via Video Note  I connected with Noni Saupe on 07/23/20 at 10:30 AM EST by a video enabled telemedicine application and verified that I am speaking with the correct person using two identifiers.  Location: Patient: work [facility] boy: clinic   I discussed the limitations of evaluation and management by telemedicine and the availability of in person appointments. The patient expressed understanding and agreed to proceed.  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 a   Acute Office Visit  Subjective:    Patient ID: Derreon Consalvo, male    DOB: March 24, 1971, 50 y.o.   MRN: [id number]  Cough and congestion-converted to in person visit with negative COVID and negative flu. Pt has used advair and albuterol for treatment in the past-no h/o asthma  or COPD   Past Medical History:  Diagnosis Date  . GERD (gastroesophageal reflux disease)    Family History  Problem Relation Age of Onset  . Heart attack Father   . Diabetes Father   . Hypertension Father   . Deep vein thrombosis Father   . Breast cancer Sister   . Heart defect Sister   . Thyroid disease Sister   . Lung cancer Maternal Grandmother   . Lung cancer Maternal Grandfather   . Heart attack Paternal Grandmother   . Lung cancer Paternal Grandfather     Social History   Socioeconomic History  . Marital status: Married    Spouse name: Not on file  . Number of children: 2  . Years of education: Not on file  . Highest education level: Associate degree: occupational, [id number], or vocational program  Occupational History  . Occupation: Scientist, product/process development: Electrical engineer  Tobacco Use  . Smoking status: Never Smoker  . Smokeless tobacco: Current User    Types: Snuff  Vaping Use  . Vaping Use: Never used  Substance and Sexual Activity  . Alcohol use: Not Currently  . Drug use: Never  . Sexual activity: Not on file  Other Topics Concern  . Not on file  Social History Narrative   Patient is right-handed. He lives with his wife and one of his daughters in a 2 story house. He rarely drinks caffeine now, ghe is very active at work.   Social Determinants of Health   Financial Resource Strain: Not on file  Food Insecurity: Not on file  Transportation Needs: Not  on file  Physical Activity: Not on file  Stress: Not on file  Social Connections: Not on file  Intimate Partner Violence: Not on file    Outpatient Medications Prior to Visit  Medication Sig Dispense Refill  . esomeprazole (NEXIUM) 40 MG capsule Take 40 mg by mouth daily at 12 noon.    . loratadine (CLARITIN) 10 MG tablet Take 10 mg by mouth daily.    Marland Kitchen amoxicillin (AMOXIL) 875 MG tablet Take 1 tablet (875 mg total) by mouth 2 (two) times daily. 20 tablet 0  . amoxicillin  (AMOXIL) 875 MG tablet Take 1 tablet (875 mg total) by mouth 2 (two) times daily. 14 tablet 0  . clarithromycin (BIAXIN) 500 MG tablet Take 1 tablet (500 mg total) by mouth 2 (two) times daily. 28 tablet 0   No facility-administered medications prior to visit.   Review of Systems  Constitutional: Negative for chills and fever.  HENT: Positive for congestion. Negative for sinus pain and sore throat.   Respiratory: Positive for cough and sputum production. Negative for shortness of breath and wheezing.   Cardiovascular: Negative for chest pain and palpitations.  Gastrointestinal: Positive for heartburn.  Genitourinary: Negative.   Skin: Negative for rash.   No Known Allergies     Objective:    Physical Exam Vitals reviewed.  Constitutional:      Appearance: Normal appearance.  HENT:     Head: Normocephalic and atraumatic.     Right Ear: Tympanic membrane and ear canal normal.     Left Ear: Tympanic membrane and ear canal normal.     Nose: Nose normal.  Eyes:     Conjunctiva/sclera: Conjunctivae normal.  Cardiovascular:     Rate and Rhythm: Normal rate and regular rhythm.     Pulses: Normal pulses.     Heart sounds: Normal heart sounds.  Pulmonary:     Effort: Pulmonary effort is normal.     Breath sounds: Normal breath sounds.  Musculoskeletal:     Cervical back: Normal range of motion and neck supple.  Neurological:     Mental Status: He is alert and oriented to person, place, and time.  Psychiatric:        Mood and Affect: Mood normal.        Behavior: Behavior normal.     BP 138/82   Pulse 81   Ht 6\' 2"  (1.88 m)   Wt 200 lb (90.7 kg)   SpO2 98%   BMI 25.68 kg/m  Wt Readings from Last 3 Encounters:  07/23/20 200 lb (90.7 kg)  09/15/19 203 lb 12.8 oz (92.4 kg)  09/12/19 195 lb (88.5 kg)    Health Maintenance Due  Topic Date Due  . Hepatitis C Screening  Never done  . COVID-19 Vaccine (1) Never done  . HIV Screening  Never done  . TETANUS/TDAP  Never  done  . INFLUENZA VACCINE  Never done    Lab Results  Component Value Date   WBC 4.3 09/15/2019   HGB 14.5 09/15/2019   HCT 43.7 09/15/2019   MCV 91 09/15/2019   PLT 258 09/15/2019   Lab Results  Component Value Date   NA 141 09/15/2019   K 4.3 09/15/2019   CO2 25 09/15/2019   GLUCOSE 99 09/15/2019   BUN 10 09/15/2019   CREATININE 1.22 09/15/2019   BILITOT 0.7 09/15/2019   ALKPHOS 69 09/15/2019   AST 15 09/15/2019   ALT 13 09/15/2019   PROT 7.2 09/15/2019   ALBUMIN  4.5 09/15/2019   CALCIUM 9.7 09/15/2019   ANIONGAP 7 12/20/2017      Assessment & Plan:     Meds ordered this encounter  Medications  . albuterol (VENTOLIN HFA) 108 (90 Base) MCG/ACT inhaler    Sig: Inhale 2 puffs into the lungs every 6 (six) hours as needed for wheezing or shortness of breath.    Dispense:  8 g    Refill:  0   1. Other fatigue - Influenza A/B - POC COVID-19  2. Cough Suggested xyzal, albuterol-rx-possible exposure on work site by history of inhalational irritants. No h/o of asthma or COPD-h/o of use of MDI and inhaled steroid. No fever. cxr if symptoms do not improved-cxr radiology order given to patient.  Simora Dingee Mat Carne, MD

## 2020-07-23 NOTE — Patient Instructions (Signed)
Albuterol MDI -2 puffs q 4-6 hours Xyzal -take one daily instead of claritin for the next week Chest xray if no improvement or worsening of symptoms

## 2020-07-24 DIAGNOSIS — R5383 Other fatigue: Secondary | ICD-10-CM | POA: Insufficient documentation

## 2020-07-24 DIAGNOSIS — R059 Cough, unspecified: Secondary | ICD-10-CM | POA: Insufficient documentation

## 2020-07-25 ENCOUNTER — Encounter: Payer: Self-pay | Admitting: Family Medicine

## 2020-07-25 ENCOUNTER — Ambulatory Visit (INDEPENDENT_AMBULATORY_CARE_PROVIDER_SITE_OTHER): Payer: BC Managed Care – PPO | Admitting: Family Medicine

## 2020-07-25 ENCOUNTER — Ambulatory Visit: Payer: BC Managed Care – PPO

## 2020-07-25 VITALS — BP 150/84 | HR 100 | Resp 20 | Ht 75.0 in

## 2020-07-25 DIAGNOSIS — R0600 Dyspnea, unspecified: Secondary | ICD-10-CM

## 2020-07-25 DIAGNOSIS — R079 Chest pain, unspecified: Secondary | ICD-10-CM

## 2020-07-25 DIAGNOSIS — T1490XA Injury, unspecified, initial encounter: Secondary | ICD-10-CM | POA: Diagnosis not present

## 2020-07-25 DIAGNOSIS — R0602 Shortness of breath: Secondary | ICD-10-CM | POA: Diagnosis not present

## 2020-07-25 DIAGNOSIS — R059 Cough, unspecified: Secondary | ICD-10-CM | POA: Diagnosis not present

## 2020-07-25 LAB — POC COVID19 BINAXNOW: SARS Coronavirus 2 Ag: NEGATIVE

## 2020-07-25 NOTE — Progress Notes (Signed)
Acute Office Visit  Subjective:    Patient ID: Brad Zuniga, male    DOB: 07/03/1971, 50 y.o.   MRN: [id number]  Chief Complaint  Patient presents with  . Chest Pain  . Shortness of Breath    HPI Patient is in today for chest pain/SOB-onset on Friday after being in a building working on a sprinkler system. Pt states building had mold and was going to be torn down. Roof had fallen in and pt had to cut pipe and sand to reconnect. Pt admits he did not wear a mask or respirator. Pt states breathing has improved but continues to cause mid sternal pain with deep breath. Pt denies wheezing and had no h/o asthma or COPD.  Pt states blood pressure usually normal. Pt states after coming out of the building pt felt fatigue and congestion. He had a headache. Pt has-used albuterol BID, zyrtec last night.  Pt states improved expansion with albuterol but can feel increase hear rate . Pt mobilizes mucous -white/yellow and thick but has not seen blood .  Pt states thick mucous.  No cough..  No fever.  Headache pressure sensation.  Chest pain-worse with deep breath "dull" pain with no radiation, no nausea.  when pt takes a deep breath chest tightness noted.  No nausea, vomiting, diarrhea over the weekend.  Appetite normal. Ulcer in the past pt -takes Nexium.  Dayquil caused symptoms to improve. Bright red blood noted with stool, with diarrhea associated with symptoms-colonoscopy 4.21-internal/external hemorrhoids. 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 GERD (gastroesophageal reflux disease)       Family History  Problem Relation Age of Onset  . Heart attack Father   . Diabetes Father   . Hypertension Father   . Deep vein thrombosis Father   . Breast cancer Sister   . Heart defect Sister   . Thyroid disease Sister   . Lung cancer Maternal Grandmother   . Lung cancer Maternal Grandfather   . Heart  attack Paternal Grandmother   . Lung cancer Paternal Grandfather     Social History   Socioeconomic History  . Marital status: Married    Spouse name: Not on file  . Number of children: 2  . Years of education: Not on file  . Highest education level: Associate degree: occupational, Scientist, product/process development, or vocational program  Occupational History  . 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 Smoking status: Never Smoker  . Smokeless tobacco: Current User    Types: Snuff  Vaping Use  . Vaping Use: Never used  Substance and Sexual Activity  . Alcohol use: Not Currently  . Drug use: Never  . Sexual activity: Not on file  Other Topics Concern  . Not on file  Social History Narrative   Patient is right-handed. He lives with his wife and one of his daughters in a 2 story house. He rarely drinks caffeine now, ghe is very active at work.   Social Determinants of Health   Financial Resource Strain: Not on file  Food Insecurity: Not on file  Transportation Needs: Not on file  Physical Activity: Not on file  Stress: Not on file  Social Connections: Not on file  Intimate Partner Violence: Not on file    Outpatient Medications Prior to Visit  Medication Sig Dispense Refill  . albuterol (VENTOLIN HFA) 108 (90  Base) MCG/ACT inhaler Inhale 2 puffs into the lungs every 6 (six) hours as needed for wheezing or shortness of breath. 8 g 0  . esomeprazole (NEXIUM) 40 MG capsule Take 40 mg by mouth daily at 12 noon.    . loratadine (CLARITIN) 10 MG tablet Take 10 mg by mouth daily.     No facility-administered medications prior to visit.    No Known Allergies  Review of Systems  Constitutional: Positive for fatigue. Negative for fever.  Respiratory: Positive for chest tightness and shortness of breath. Negative for cough, choking and wheezing.   Cardiovascular: Negative for chest pain, palpitations and leg swelling.  Gastrointestinal: Negative for  diarrhea, nausea and vomiting.  Skin: Negative for rash.  Neurological: Negative for dizziness, syncope, weakness, light-headedness and numbness.       Objective:    Physical Exam Constitutional:      Appearance: He is well-developed.  HENT:     Head: Normocephalic and atraumatic.  Pulmonary:     Effort: Pulmonary effort is normal.     Breath sounds: Normal breath sounds. No stridor.  Chest:     Chest wall: No tenderness.  Abdominal:     General: Bowel sounds are normal.  Neurological:     Mental Status: He is alert.     BP (!) 150/84   Pulse 100   Resp 20   Ht 6\' 3"  (1.905 m)   SpO2 98%   BMI 25.00 kg/m  Wt Readings from Last 3 Encounters:  07/23/20 200 lb (90.7 kg)  09/15/19 203 lb 12.8 oz (92.4 kg)  09/12/19 195 lb (88.5 kg)    Health Maintenance Due  Topic Date Due  . Hepatitis C Screening  Never done  . COVID-19 Vaccine (1) Never done  . HIV Screening  Never done  . TETANUS/TDAP  Never done  . INFLUENZA VACCINE  Never done     Lab Results  Component Value Date   WBC 4.3 09/15/2019   HGB 14.5 09/15/2019   HCT 43.7 09/15/2019   MCV 91 09/15/2019   PLT 258 09/15/2019   Lab Results  Component Value Date   NA 141 09/15/2019   K 4.3 09/15/2019   CO2 25 09/15/2019   GLUCOSE 99 09/15/2019   BUN 10 09/15/2019   CREATININE 1.22 09/15/2019   BILITOT 0.7 09/15/2019   ALKPHOS 69 09/15/2019   AST 15 09/15/2019   ALT 13 09/15/2019   PROT 7.2 09/15/2019   ALBUMIN 4.5 09/15/2019   CALCIUM 9.7 09/15/2019   ANIONGAP 7 12/20/2017      Assessment & Plan:  1. Chest pain, unspecified type - POC COVID-19-NEGATIVE 2. Shortness of breath Pt with building inspections-sanding the pipes to repair with no mask or respirator. D/w pt increase in SOB intensity -hospital to evaluate for PE 3. Dyspnea, unspecified type - POC COVID-19-cxr negative, COVID negative 4. Inhalation injury Continue use of albuterol - Ambulatory referral to Pulmonology-concern  environmental exposure-pt had not contact with smoke or carbon monoxide. Pt feel likely mold as building in disrepair and ready to be torn down Jamae Tison 02/19/2018, MD

## 2020-08-03 ENCOUNTER — Telehealth (INDEPENDENT_AMBULATORY_CARE_PROVIDER_SITE_OTHER): Payer: BC Managed Care – PPO | Admitting: Family Medicine

## 2020-08-03 VITALS — BP 142/72 | Temp 96.6°F | Resp 18 | Wt 195.0 lb

## 2020-08-03 DIAGNOSIS — K921 Melena: Secondary | ICD-10-CM

## 2020-08-03 DIAGNOSIS — K648 Other hemorrhoids: Secondary | ICD-10-CM

## 2020-08-03 DIAGNOSIS — R42 Dizziness and giddiness: Secondary | ICD-10-CM | POA: Diagnosis not present

## 2020-08-03 MED ORDER — HYDROCORTISONE ACETATE 25 MG RE SUPP: 25.0000 mg | Freq: Two times a day (BID) | RECTAL | 1 refills | Status: DC

## 2020-08-03 NOTE — Progress Notes (Signed)
Acute Office Visit  Subjective:    Patient ID: Brad Zuniga, male    DOB: 01/02/1971, 50 y.o.   MRN: [id number]  Chief Complaint  Patient presents with  . Rectal Bleeding         HPI Patient is in today for rectal bleeding Pt states that this week he has been having significant bleeding with bowel movements - states that the blood 'fills the bowl.' He has a history of internal hemorrhoids and saw Dr Jennye Boroughs for a colonoscopy in 4/21 and was supposed to follow up in 2 months but never did It is noted that he had a visit on 1/12 with Dr Judee Clara and was having rectal bleeding at that time as well Pt states the past several days he has started to feel lightheaded and yesterday, it got so bad that he felt like he was going to pass out Pt was seen on 1/12 for chest pain and dyspnea - he states that he has continued to have some pain in his left pectoral area intermittently - states the pain can come with or without activity and lasts for a minute or so Per his history he was sent for a chest CT which was normal (results not in chart.)  Per patient EKG was done. He was sent for labwork at the hospital, as well.  Past Medical History:  Diagnosis Date  . GERD (gastroesophageal reflux disease)    No past surgical history on file.  Family History  Problem Relation Age of Onset  . Heart attack Father   . Diabetes Father   . Hypertension Father   . Deep vein thrombosis Father   . Breast cancer Sister   . Heart defect Sister   . Thyroid disease Sister   . Lung cancer Maternal Grandmother   . Lung cancer Maternal Grandfather   . Heart attack Paternal Grandmother   . Lung cancer Paternal Grandfather     Social History   Socioeconomic History  . Marital status: Married    Spouse name: Not on file  . Number of children: 2  . Years of education: Not on file  . Highest education level: Associate degree: occupational, Scientist, product/process development, or vocational program  Occupational History  .  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 Smoking status: Never Smoker  . Smokeless tobacco: Current User    Types: Snuff  Vaping Use  . Vaping Use: Never used  Substance and Sexual Activity  . Alcohol use: Not Currently  . Drug use: Never  . Sexual activity: Not on file  Other Topics Concern  . Not on file  Social History Narrative   Patient is right-handed. He lives with his wife and one of his daughters in a 2 story house. He rarely drinks caffeine now, ghe is very active at work.   Social Determinants of Health   Financial Resource Strain: Not on file  Food Insecurity: Not on file  Transportation Needs: Not on file  Physical Activity: Not on file  Stress: Not on file  Social Connections: Not on file  Intimate Partner Violence: Not on file    Outpatient Medications Prior to Visit  Medication Sig Dispense Refill  . albuterol (VENTOLIN HFA) 108 (90 Base) MCG/ACT inhaler Inhale 2 puffs into the lungs every 6 (six) hours as needed for wheezing or shortness of breath. 8 g 0  . esomeprazole (NEXIUM) 40 MG capsule Take 40 mg by mouth daily at 12 noon.    Marland Kitchen  loratadine (CLARITIN) 10 MG tablet Take 10 mg by mouth daily.     No facility-administered medications prior to visit.    No Known Allergies  Review of Systems CONSTITUTIONAL: see HPI E/N/T: Negative for ear pain, nasal congestion and sore throat.  CARDIOVASCULAR: see HPI GASTROINTESTINAL:see HPI MSK: Negative for arthralgias and myalgias.  INTEGUMENTARY: Negative for rash.  NEUROLOGICAL:see HPI         Objective:    Physical Exam Vitals reviewed.  Constitutional:      Appearance: Normal appearance.  Cardiovascular:     Rate and Rhythm: Normal rate and regular rhythm.     Pulses: Normal pulses.     Heart sounds: Normal heart sounds.  Pulmonary:     Effort: Pulmonary effort is normal.     Breath sounds: Normal breath sounds. No wheezing, rhonchi or rales.   Abdominal:     General: Bowel sounds are normal.     Palpations: Abdomen is soft.     Tenderness: There is no abdominal tenderness.  Genitourinary:    Comments: External hemorrhoids. No blood seen. Neurological:     Mental Status: He is alert.  Psychiatric:        Mood and Affect: Mood normal.        Behavior: Behavior normal.    BP (!) 142/72   Temp (!) 96.6 F (35.9 C)   Resp 18   Wt 195 lb (88.5 kg)   SpO2 99%   BMI 24.37 kg/m  Wt Readings from Last 3 Encounters:  08/03/20 195 lb (88.5 kg)  07/23/20 200 lb (90.7 kg)  09/15/19 203 lb 12.8 oz (92.4 kg)    Health Maintenance Due  Topic Date Due  . Hepatitis C Screening  Never done  . COVID-19 Vaccine (1) Never done  . HIV Screening  Never done  . TETANUS/TDAP  Never done  . INFLUENZA VACCINE  Never done    There are no preventive care reminders to display for this patient.   No results found for: TSH Lab Results  Component Value Date   WBC 4.3 09/15/2019   HGB 14.5 09/15/2019   HCT 43.7 09/15/2019   MCV 91 09/15/2019   PLT 258 09/15/2019   Lab Results  Component Value Date   NA 141 09/15/2019   K 4.3 09/15/2019   CO2 25 09/15/2019   GLUCOSE 99 09/15/2019   BUN 10 09/15/2019   CREATININE 1.22 09/15/2019   BILITOT 0.7 09/15/2019   ALKPHOS 69 09/15/2019   AST 15 09/15/2019   ALT 13 09/15/2019   PROT 7.2 09/15/2019   ALBUMIN 4.5 09/15/2019   CALCIUM 9.7 09/15/2019   ANIONGAP 7 12/20/2017   No results found for: CHOL No results found for: HDL No results found for: LDLCALC No results found for: TRIG No results found for: CHOLHDL No results found for: NUUV2Z     Assessment & Plan:  1. Other hemorrhoids Refer to Dr. Kinnie Scales in [place].   2. Blood in stool - CBC with Differential/Platelet  3. Dizziness Not orthostatic.  Recommend hydration.     Meds ordered this encounter  Medications  . hydrocortisone (ANUSOL-HC) 25 MG suppository    Sig: Place 1 suppository (25 mg total) rectally 2  (two) times daily.    Dispense:  12 suppository    Refill:  1    Orders Placed This Encounter  Procedures  . CBC with Differential/Platelet    Follow-up: No follow-ups on file.  An After Visit Summary was printed and given  to the patient.  Rochel Brome, MD [facility] [phone]

## 2020-08-06 ENCOUNTER — Telehealth: Payer: Self-pay

## 2020-08-06 DIAGNOSIS — R079 Chest pain, unspecified: Secondary | ICD-10-CM | POA: Diagnosis not present

## 2020-08-06 DIAGNOSIS — D62 Acute posthemorrhagic anemia: Secondary | ICD-10-CM | POA: Diagnosis not present

## 2020-08-06 DIAGNOSIS — G40A09 Absence epileptic syndrome, not intractable, without status epilepticus: Secondary | ICD-10-CM | POA: Diagnosis not present

## 2020-08-06 DIAGNOSIS — R001 Bradycardia, unspecified: Secondary | ICD-10-CM | POA: Diagnosis not present

## 2020-08-06 DIAGNOSIS — R55 Syncope and collapse: Secondary | ICD-10-CM | POA: Diagnosis not present

## 2020-08-06 NOTE — Telephone Encounter (Signed)
Pt called office stating he is still passing blood but had an episode this morning where he became really dizzy, believes he blacked out for a second. He is also continuing to have chest pain in "left pec." He left work and went to his mother's house who is retired Charity fundraiser. She took his BP which was 168/90. When call was transferred to [facility], mother got pulse which was 76 and oxygen which was 97%. They stated it had been about 5 minutes since previous BP so they retook it. This time 162/104. Pt was instructed to go to ED to be evaluated further. Provider already aware.

## 2020-08-07 DIAGNOSIS — D649 Anemia, unspecified: Secondary | ICD-10-CM | POA: Diagnosis not present

## 2020-08-07 DIAGNOSIS — I498 Other specified cardiac arrhythmias: Secondary | ICD-10-CM | POA: Diagnosis not present

## 2020-08-07 DIAGNOSIS — R001 Bradycardia, unspecified: Secondary | ICD-10-CM | POA: Diagnosis not present

## 2020-08-07 DIAGNOSIS — R55 Syncope and collapse: Secondary | ICD-10-CM | POA: Diagnosis not present

## 2020-08-07 DIAGNOSIS — D62 Acute posthemorrhagic anemia: Secondary | ICD-10-CM

## 2020-08-07 DIAGNOSIS — R42 Dizziness and giddiness: Secondary | ICD-10-CM | POA: Diagnosis not present

## 2020-08-08 DIAGNOSIS — E611 Iron deficiency: Secondary | ICD-10-CM | POA: Diagnosis not present

## 2020-08-08 DIAGNOSIS — G40A09 Absence epileptic syndrome, not intractable, without status epilepticus: Secondary | ICD-10-CM | POA: Diagnosis not present

## 2020-08-08 DIAGNOSIS — Z8619 Personal history of other infectious and parasitic diseases: Secondary | ICD-10-CM | POA: Diagnosis not present

## 2020-08-08 DIAGNOSIS — K648 Other hemorrhoids: Secondary | ICD-10-CM | POA: Diagnosis not present

## 2020-08-08 DIAGNOSIS — I498 Other specified cardiac arrhythmias: Secondary | ICD-10-CM | POA: Diagnosis not present

## 2020-08-08 DIAGNOSIS — K644 Residual hemorrhoidal skin tags: Secondary | ICD-10-CM | POA: Diagnosis not present

## 2020-08-08 DIAGNOSIS — K219 Gastro-esophageal reflux disease without esophagitis: Secondary | ICD-10-CM | POA: Diagnosis not present

## 2020-08-08 DIAGNOSIS — K229 Disease of esophagus, unspecified: Secondary | ICD-10-CM | POA: Diagnosis not present

## 2020-08-08 DIAGNOSIS — F17220 Nicotine dependence, chewing tobacco, uncomplicated: Secondary | ICD-10-CM | POA: Diagnosis not present

## 2020-08-08 DIAGNOSIS — R001 Bradycardia, unspecified: Secondary | ICD-10-CM | POA: Diagnosis not present

## 2020-08-08 DIAGNOSIS — Z8711 Personal history of peptic ulcer disease: Secondary | ICD-10-CM | POA: Diagnosis not present

## 2020-08-08 DIAGNOSIS — K317 Polyp of stomach and duodenum: Secondary | ICD-10-CM | POA: Diagnosis not present

## 2020-08-08 DIAGNOSIS — Z79899 Other long term (current) drug therapy: Secondary | ICD-10-CM | POA: Diagnosis not present

## 2020-08-08 DIAGNOSIS — D649 Anemia, unspecified: Secondary | ICD-10-CM | POA: Diagnosis not present

## 2020-08-08 DIAGNOSIS — R55 Syncope and collapse: Secondary | ICD-10-CM | POA: Diagnosis not present

## 2020-08-08 DIAGNOSIS — K208 Other esophagitis without bleeding: Secondary | ICD-10-CM | POA: Diagnosis not present

## 2020-08-08 DIAGNOSIS — K449 Diaphragmatic hernia without obstruction or gangrene: Secondary | ICD-10-CM | POA: Diagnosis not present

## 2020-08-08 DIAGNOSIS — D62 Acute posthemorrhagic anemia: Secondary | ICD-10-CM | POA: Diagnosis not present

## 2020-08-09 ENCOUNTER — Telehealth: Payer: Self-pay | Admitting: Cardiology

## 2020-08-09 DIAGNOSIS — I498 Other specified cardiac arrhythmias: Secondary | ICD-10-CM | POA: Diagnosis not present

## 2020-08-09 DIAGNOSIS — R001 Bradycardia, unspecified: Secondary | ICD-10-CM | POA: Diagnosis not present

## 2020-08-09 DIAGNOSIS — K449 Diaphragmatic hernia without obstruction or gangrene: Secondary | ICD-10-CM | POA: Diagnosis not present

## 2020-08-09 DIAGNOSIS — K317 Polyp of stomach and duodenum: Secondary | ICD-10-CM | POA: Diagnosis not present

## 2020-08-09 DIAGNOSIS — R55 Syncope and collapse: Secondary | ICD-10-CM | POA: Diagnosis not present

## 2020-08-09 DIAGNOSIS — K229 Disease of esophagus, unspecified: Secondary | ICD-10-CM | POA: Diagnosis not present

## 2020-08-09 DIAGNOSIS — D649 Anemia, unspecified: Secondary | ICD-10-CM | POA: Diagnosis not present

## 2020-08-09 NOTE — Telephone Encounter (Signed)
Spoke to the patient just now and let him know Dr. Mallory Shirk recommendations. He verbalizes understanding and states that he will stop by our office tomorrow for the live monitor to be placed. He is to wear this monitor for two weeks.    Encouraged patient to call back with any questions or concerns.

## 2020-08-09 NOTE — Telephone Encounter (Signed)
Patient says he saw Dr. Servando Salina at [facility]. Would like to speak with Dr. Servando Salina or nurse. Please call back

## 2020-08-09 NOTE — Telephone Encounter (Signed)
Dr. Shon Millet in [place] has seen some of my patients in the past.  I can refer him when I see him in the office.  Also Lequita Halt could you please set up for him to be able to get a monitor on discharge from the hospital.  I will be for presyncope.

## 2020-08-09 NOTE — Telephone Encounter (Signed)
Spoke to the patient just now. He states that he is currently in [facility] right now and is needing to be seen by a neurologist. He states that he is worried that at the hospital they will make him see someone that is not in-person and is just on the virtual screen. He is wanting Dr. Mallory Shirk recommendation on a good Neurologist that he could see. He believes he will be discharged this afternoon.

## 2020-08-10 ENCOUNTER — Ambulatory Visit (INDEPENDENT_AMBULATORY_CARE_PROVIDER_SITE_OTHER): Payer: BC Managed Care – PPO

## 2020-08-10 ENCOUNTER — Ambulatory Visit (INDEPENDENT_AMBULATORY_CARE_PROVIDER_SITE_OTHER): Payer: BC Managed Care – PPO | Admitting: Cardiology

## 2020-08-10 ENCOUNTER — Other Ambulatory Visit: Payer: Self-pay

## 2020-08-10 VITALS — Ht 75.0 in | Wt 195.0 lb

## 2020-08-10 DIAGNOSIS — R42 Dizziness and giddiness: Secondary | ICD-10-CM | POA: Diagnosis not present

## 2020-08-10 DIAGNOSIS — R55 Syncope and collapse: Secondary | ICD-10-CM

## 2020-08-12 ENCOUNTER — Encounter: Payer: Self-pay | Admitting: Family Medicine

## 2020-08-13 ENCOUNTER — Encounter: Payer: Self-pay | Admitting: Cardiology

## 2020-08-14 DIAGNOSIS — K219 Gastro-esophageal reflux disease without esophagitis: Secondary | ICD-10-CM | POA: Insufficient documentation

## 2020-08-15 ENCOUNTER — Ambulatory Visit: Payer: BC Managed Care – PPO | Admitting: Cardiology

## 2020-08-15 ENCOUNTER — Other Ambulatory Visit: Payer: Self-pay

## 2020-08-15 ENCOUNTER — Telehealth: Payer: Self-pay | Admitting: Cardiology

## 2020-08-15 ENCOUNTER — Encounter: Payer: Self-pay | Admitting: Cardiology

## 2020-08-15 VITALS — BP 134/88 | HR 62 | Ht 74.0 in | Wt 204.6 lb

## 2020-08-15 DIAGNOSIS — R42 Dizziness and giddiness: Secondary | ICD-10-CM

## 2020-08-15 DIAGNOSIS — Z0279 Encounter for issue of other medical certificate: Secondary | ICD-10-CM

## 2020-08-15 DIAGNOSIS — R55 Syncope and collapse: Secondary | ICD-10-CM

## 2020-08-15 DIAGNOSIS — R0789 Other chest pain: Secondary | ICD-10-CM

## 2020-08-15 MED ORDER — METOPROLOL TARTRATE 100 MG PO TABS: ORAL_TABLET | ORAL | 0 refills | Status: DC

## 2020-08-15 NOTE — Progress Notes (Signed)
Cardiology Office Note:    Date:  08/15/2020   ID:  Brad Zuniga, DOB 1970-12-19, MRN [id number]  PCP:  Marge Duncans, PA-C  Cardiologist:  Berniece Salines, DO  Electrophysiologist:  None   Referring MD: Marge Duncans, PA-C  Is still having some dizziness.  History of Present Illness:    Brad Zuniga is a 50 y.o. male with a hx of 50 year old male who was recently admitted to [facility] for presyncope episodes.  During his hospitalization etiology was consulted to evaluated patient.  Upon evaluating the patient patient reported that he had had multiple episodes of presyncope.  In addition he had intermittent chest discomfort which he described as left-sided and sometimes right-sided.  Does not watch every radiation but is concerning as the episodes are getting frequent intermittent.  During his hospitalization he was seen by neurology his MRI of the head was reported to be normal his CTA did not show any evidence of any significant ischemia.  He also did have some anemia and he was seen by GI.  During hospital patient he underwent a flex sigmoidoscopy  Past Medical History:  Diagnosis Date  . GERD (gastroesophageal reflux disease)     History reviewed. No pertinent surgical history.  Current Medications: Current Meds  Medication Sig  . esomeprazole (NEXIUM) 40 MG capsule Take 40 mg by mouth daily at 12 noon.  . hydrocortisone (ANUSOL-HC) 25 MG suppository Place 1 suppository (25 mg total) rectally 2 (two) times daily.  Marland Kitchen loratadine (CLARITIN) 10 MG tablet Take 10 mg by mouth daily.  . metoprolol tartrate (LOPRESSOR) 100 MG tablet Take 2 hours before your CT  . [DISCONTINUED] albuterol (VENTOLIN HFA) 108 (90 Base) MCG/ACT inhaler Inhale 2 puffs into the lungs every 6 (six) hours as needed for wheezing or shortness of breath.     Allergies:   Patient has no known allergies.   Social History   Socioeconomic History  . Marital status: Married    Spouse name: Not on file  . Number of  children: 2  . Years of education: Not on file  . Highest education level: Associate degree: occupational, Hotel manager, or vocational program  Occupational History  . Occupation: Automotive engineer: Orthoptist  Tobacco Use  . Smoking status: Never Smoker  . Smokeless tobacco: Current User    Types: Snuff  Vaping Use  . Vaping Use: Never used  Substance and Sexual Activity  . Alcohol use: Not Currently  . Drug use: Never  . Sexual activity: Not on file  Other Topics Concern  . Not on file  Social History Narrative   Patient is right-handed. He lives with his wife and one of his daughters in a 2 story house. He rarely drinks caffeine now, ghe is very active at work.   Social Determinants of Health   Financial Resource Strain: Not on file  Food Insecurity: Not on file  Transportation Needs: Not on file  Physical Activity: Not on file  Stress: Not on file  Social Connections: Not on file     Family History: The patient's family history includes Breast cancer in his sister; Deep vein thrombosis in his father; Diabetes in his father; Heart attack in his father and paternal grandmother; Heart defect in his sister; Hypertension in his father; Lung cancer in his maternal grandfather, maternal grandmother, and paternal grandfather; Thyroid disease in his sister.  ROS:   Review of Systems  Constitution: Negative for decreased appetite, fever and weight gain.  HENT: Negative for congestion, ear discharge, hoarse voice and sore throat.   Eyes: Negative for discharge, redness, vision loss in right eye and visual halos.  Cardiovascular: Negative for chest pain, dyspnea on exertion, leg swelling, orthopnea and palpitations.  Respiratory: Negative for cough, hemoptysis, shortness of breath and snoring.   Endocrine: Negative for heat intolerance and polyphagia.  Hematologic/Lymphatic: Negative for bleeding problem. Does not bruise/bleed easily.  Skin: Negative for  flushing, nail changes, rash and suspicious lesions.  Musculoskeletal: Negative for arthritis, joint pain, muscle cramps, myalgias, neck pain and stiffness.  Gastrointestinal: Negative for abdominal pain, bowel incontinence, diarrhea and excessive appetite.  Genitourinary: Negative for decreased libido, genital sores and incomplete emptying.  Neurological: Negative for brief paralysis, focal weakness, headaches and loss of balance.  Psychiatric/Behavioral: Negative for altered mental status, depression and suicidal ideas.  Allergic/Immunologic: Negative for HIV exposure and persistent infections.    EKGs/Labs/Other Studies Reviewed:    The following studies were reviewed today:   EKG: None today   Echocardiogram done on August 08, 2019 showed evidence of EF-normal 55 to 60%.  Right ventricle is normal size and function.  Left atrium normal size.  Right atrium normal size.  Aortic valve is trileaflet with no stenosis or regurgitation.  There is trace mitral vegetation.  Trace tricuspid regurgitation.  Aortic root aortic arch ascending proximal aorta was all within normal limits.  MRI of the brain unremarkable with mild paraspinal sinuses.  EEG done inpatient showed no clear epileptiform activity was noted and therefore no definite evidence was told of a seizure disorder.  Slow transit noted in the left temporal area indicated slight focal neurophysiologic disturbance in the left temporal region.  CT scan of the head unremarkable noncontrast CT appearance of the brain no evidence of any acute intracranial abnormalities. CT scan of the neck the carotid artery, internal carotid and vertebral arteries are patent with the neck without stenosis.  No significant atherosclerosis disease.  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  Recent Labs: 09/15/2019: ALT 13; BUN 10; Creatinine, Ser 1.22; Hemoglobin 14.5; Platelets 258; Potassium 4.3;  Sodium 141  Recent Lipid Panel No results found for: CHOL, TRIG, HDL, CHOLHDL, VLDL, LDLCALC, LDLDIRECT  Physical Exam:    VS:  BP 134/88   Pulse 62   Ht 6' 2"  (1.88 m)   Wt 204 lb 9.6 oz (92.8 kg)   SpO2 98%   BMI 26.27 kg/m     Wt Readings from Last 3 Encounters:  08/15/20 204 lb 9.6 oz (92.8 kg)  08/13/20 195 lb (88.5 kg)  08/03/20 195 lb (88.5 kg)     GEN: Well nourished, well developed in no acute distress HEENT: Normal NECK: No JVD; No carotid bruits LYMPHATICS: No lymphadenopathy CARDIAC: S1S2 noted,RRR, no murmurs, rubs, gallops RESPIRATORY:  Clear to auscultation without rales, wheezing or rhonchi  ABDOMEN: Soft, non-tender, non-distended, +bowel sounds, no guarding. EXTREMITIES: No edema, No cyanosis, no clubbing MUSCULOSKELETAL:  No deformity  SKIN: Warm and dry NEUROLOGIC:  Alert and oriented x 3, non-focal PSYCHIATRIC:  Normal affect, good insight  ASSESSMENT:    1. Postural dizziness with presyncope   2. Other chest pain    PLAN:     Post hospitalization we had the patient come and get a monitor which had been placed on him he is wearing it today.  I am hoping that he can complete a 14-day and he will be able to get some answers  from this. In the meantime he has had chest pain and he has family history of premature coronary artery disease and other risk factors I like to have the patient to go a coronary CTA to understand coronary artery disease is playing a role here.  We will also get lipid panel today.  Along with CBC and BMP. I have again reviewed his information from the hospital.  The patient is in agreement with the above plan. The patient left the office in stable condition.  The patient will follow up in   Medication Adjustments/Labs and Tests Ordered: Current medicines are reviewed at length with the patient today.  Concerns regarding medicines are outlined above.  Orders Placed This Encounter  Procedures  . CT CORONARY MORPH W/CTA COR  W/SCORE W/CA W/CM &/OR WO/CM  . CT CORONARY FRACTIONAL FLOW RESERVE DATA PREP  . CT CORONARY FRACTIONAL FLOW RESERVE FLUID ANALYSIS  . Basic metabolic panel  . CBC with Differential/Platelet  . Lipid panel   Meds ordered this encounter  Medications  . metoprolol tartrate (LOPRESSOR) 100 MG tablet    Sig: Take 2 hours before your CT    Dispense:  1 tablet    Refill:  0    Patient Instructions  Medication Instructions:  Your physician recommends that you continue on your current medications as directed. Please refer to the Current Medication list given to you today.  *If you need a refill on your cardiac medications before your next appointment, please call your pharmacy*   Lab Work: Your physician recommends that you return for lab work today: [facility], BMET  If you have labs (blood work) drawn today and your tests are completely normal, you will receive your results only by: Marland Kitchen MyChart Message (if you have MyChart) OR . A paper copy in the mail If you have any lab test that is abnormal or we need to change your treatment, we will call you to review the results.   Testing/Procedures: Your cardiac CT will be scheduled at:  [facility] [address] [place], [place] [address] [phone]  If scheduled at [facility], please arrive at the [facility] main entrance of [facility] 30 minutes prior to test start time. Proceed to the [facility] Radiology Department (first floor) to check-in and test prep.   Please follow these instructions carefully (unless otherwise directed):  Hold all erectile dysfunction medications at least 3 days (72 hrs) prior to test.  On the Night Before the Test: . Be sure to Drink plenty of water. . Do not consume any caffeinated/decaffeinated beverages or chocolate 12 hours prior to your test. . Do not take any antihistamines 12 hours prior to your test.  On the Day of the Test: . Drink plenty of water. Do not  drink any water within one hour of the test. . Do not eat any food 4 hours prior to the test. . You may take your regular medications prior to the test.  . Take metoprolol (Lopressor) two hours prior to test.         After the Test: . Drink plenty of water. . After receiving IV contrast, you may experience a mild flushed feeling. This is normal. . On occasion, you may experience a mild rash up to 24 hours after the test. This is not dangerous. If this occurs, you can take Benadryl 25 mg and increase your fluid intake. . If you experience trouble breathing, this can be serious. If it is  severe call 911 IMMEDIATELY. If it is mild, please call our office.  Once we have confirmed authorization from your insurance company, we will call you to set up a date and time for your test. Based on how quickly your insurance processes prior authorizations requests, please allow up to 4 weeks to be contacted for scheduling your Cardiac CT appointment. Be advised that routine Cardiac CT appointments could be scheduled as many as 8 weeks after your provider has ordered it.  For non-scheduling related questions, please contact the cardiac imaging nurse navigator should you have any questions/concerns: Marchia Bond, Cardiac Imaging Nurse Navigator Burley Saver, Interim Cardiac Imaging Nurse [place] [facility] Direct Office Dial: [phone]   For scheduling needs, including cancellations and rescheduling, please call [place], [phone].     Follow-Up: At [facility], you and your health needs are our priority.  As part of our continuing mission to provide you with exceptional heart care, we have created designated Provider Care Teams.  These Care Teams include your primary Cardiologist (physician) and Advanced Practice Providers (APPs -  Physician Assistants and Nurse Practitioners) who all work together to provide you with the care you need, when you need it.  We  recommend signing up for the patient portal called "MyChart".  Sign up information is provided on this After Visit Summary.  MyChart is used to connect with patients for Virtual Visits (Telemedicine).  Patients are able to view lab/test results, encounter notes, upcoming appointments, etc.  Non-urgent messages can be sent to your provider as well.   To learn more about what you can do with MyChart, go to [email or web address].    Your next appointment:   3 month(s)  The format for your next appointment:   In Person  Provider:   Berniece Salines, DO   Other Instructions      Adopting a Healthy Lifestyle.  Know what a healthy weight is for you (roughly BMI <25) and aim to maintain this   Aim for 7+ servings of fruits and vegetables daily   65-80+ fluid ounces of water or unsweet tea for healthy kidneys   Limit to max 1 drink of alcohol per day; avoid smoking/tobacco   Limit animal fats in diet for cholesterol and heart health - choose grass fed whenever available   Avoid highly processed foods, and foods high in saturated/trans fats   Aim for low stress - take time to unwind and care for your mental health   Aim for 150 min of moderate intensity exercise weekly for heart health, and weights twice weekly for bone health   Aim for 7-9 hours of sleep daily   When it comes to diets, agreement about the perfect plan isnt easy to find, even among the experts. Experts at the [employer] developed an idea known as the Healthy Eating Plate. Just imagine a plate divided into logical, healthy portions.   The emphasis is on diet quality:   Load up on vegetables and fruits - one-half of your plate: Aim for color and variety, and remember that potatoes dont count.   Go for whole grains - one-quarter of your plate: Whole wheat, barley, wheat berries, quinoa, oats, brown rice, and foods made with them. If you want pasta, go with whole wheat pasta.   Protein power -  one-quarter of your plate: Fish, chicken, beans, and nuts are all healthy, versatile protein sources. Limit red meat.   The diet, however, does go beyond the  plate, offering a few other suggestions.   Use healthy plant oils, such as olive, canola, soy, corn, sunflower and peanut. Check the labels, and avoid partially hydrogenated oil, which have unhealthy trans fats.   If youre thirsty, drink water. Coffee and tea are good in moderation, but skip sugary drinks and limit milk and dairy products to one or two daily servings.   The type of carbohydrate in the diet is more important than the amount. Some sources of carbohydrates, such as vegetables, fruits, whole grains, and beans-are healthier than others.   Finally, stay active  Signed, Berniece Salines, DO  08/15/2020 12:12 PM    [place]

## 2020-08-15 NOTE — Telephone Encounter (Signed)
08/15/20 Short-Term Disability Forms dropped off by patient. $29 Cash collected. Forms placed in Dr. Mallory Shirk box. Call Gunnar Fusi [phone] for pick-up.Noreene Filbert

## 2020-08-15 NOTE — Patient Instructions (Addendum)
Medication Instructions:  Your physician recommends that you continue on your current medications as directed. Please refer to the Current Medication list given to you today.  *If you need a refill on your cardiac medications before your next appointment, please call your pharmacy*   Lab Work: Your physician recommends that you return for lab work today: [facility], [facility]  If you have labs (blood work) drawn today and your tests are completely normal, you will receive your results only by: Marland Kitchen MyChart Message (if you have MyChart) OR . A paper copy in the mail If you have any lab test that is abnormal or we need to change your treatment, we will call you to review the results.   Testing/Procedures: Your cardiac CT will be scheduled at:  [facility] [address] [place], [place] [address] [phone]  If scheduled at [facility], please arrive at the [facility] main entrance of [facility] 30 minutes prior to test start time. Proceed to the [facility] Radiology Department (first floor) to check-in and test prep.   Please follow these instructions carefully (unless otherwise directed):  Hold all erectile dysfunction medications at least 3 days (72 hrs) prior to test.  On the Night Before the Test: . Be sure to Drink plenty of water. . Do not consume any caffeinated/decaffeinated beverages or chocolate 12 hours prior to your test. . Do not take any antihistamines 12 hours prior to your test.  On the Day of the Test: . Drink plenty of water. Do not drink any water within one hour of the test. . Do not eat any food 4 hours prior to the test. . You may take your regular medications prior to the test.  . Take metoprolol (Lopressor) two hours prior to test.         After the Test: . Drink plenty of water. . After receiving IV contrast, you may experience a mild flushed feeling. This is normal. . On occasion, you may experience a mild rash up to  24 hours after the test. This is not dangerous. If this occurs, you can take Benadryl 25 mg and increase your fluid intake. . If you experience trouble breathing, this can be serious. If it is severe call 911 IMMEDIATELY. If it is mild, please call our office.  Once we have confirmed authorization from your insurance company, we will call you to set up a date and time for your test. Based on how quickly your insurance processes prior authorizations requests, please allow up to 4 weeks to be contacted for scheduling your Cardiac CT appointment. Be advised that routine Cardiac CT appointments could be scheduled as many as 8 weeks after your provider has ordered it.  For non-scheduling related questions, please contact the cardiac imaging nurse navigator should you have any questions/concerns: Rockwell Alexandria, Cardiac Imaging Nurse Navigator Mitzi Hansen, Interim Cardiac Imaging Nurse Navigator [facility] Direct Office Dial: [phone]   For scheduling needs, including cancellations and rescheduling, please call [place], [phone].     Follow-Up: At [facility], you and your health needs are our priority.  As part of our continuing mission to provide you with exceptional heart care, we have created designated Provider Care Teams.  These Care Teams include your primary Cardiologist (physician) and Advanced Practice Providers (APPs -  Physician Assistants and Nurse Practitioners) who all work together to provide you with the care you need, when you need it.  We recommend signing up  for the patient portal called "MyChart".  Sign up information is provided on this After Visit Summary.  MyChart is used to connect with patients for Virtual Visits (Telemedicine).  Patients are able to view lab/test results, encounter notes, upcoming appointments, etc.  Non-urgent messages can be sent to your provider as well.   To learn more about what you can do with MyChart, go to  [email or web address].    Your next appointment:   3 month(s)  The format for your next appointment:   In Person  Provider:   Berniece Salines, DO   Other Instructions

## 2020-08-16 LAB — LIPID PANEL
Chol/HDL Ratio: 4.3 ratio (ref 0.0–5.0)
Cholesterol, Total: 169 mg/dL (ref 100–199)
HDL: 39 mg/dL — ABNORMAL LOW (ref >39)
LDL Chol Calc (NIH): 116 mg/dL — ABNORMAL HIGH (ref 0–99)
Triglycerides: 74 mg/dL (ref 0–149)
VLDL Cholesterol Cal: 14 mg/dL (ref 5–40)

## 2020-08-16 LAB — BASIC METABOLIC PANEL
BUN/Creatinine Ratio: 15 (ref 9–20)
BUN: 15 mg/dL (ref 6–24)
CO2: 28 mmol/L (ref 20–29)
Calcium: 9.1 mg/dL (ref 8.7–10.2)
Chloride: 102 mmol/L (ref 96–106)
Creatinine, Ser: 1.01 mg/dL (ref 0.76–1.27)
GFR calc Af Amer: 100 mL/min/{1.73_m2} (ref >59)
GFR calc non Af Amer: 86 mL/min/{1.73_m2} (ref >59)
Glucose: 110 mg/dL — ABNORMAL HIGH (ref 65–99)
Potassium: 4.1 mmol/L (ref 3.5–5.2)
Sodium: 139 mmol/L (ref 134–144)

## 2020-08-16 LAB — CBC WITH DIFFERENTIAL/PLATELET
Basophils Absolute: 0 10*3/uL (ref 0.0–0.2)
Basos: 1 %
EOS (ABSOLUTE): 0.3 10*3/uL (ref 0.0–0.4)
Eos: 6 %
Hematocrit: 40.5 % (ref 37.5–51.0)
Hemoglobin: 13.4 g/dL (ref 13.0–17.7)
Immature Grans (Abs): 0 10*3/uL (ref 0.0–0.1)
Immature Granulocytes: 0 %
Lymphocytes Absolute: 1.3 10*3/uL (ref 0.7–3.1)
Lymphs: 30 %
MCH: 29.5 pg (ref 26.6–33.0)
MCHC: 33.1 g/dL (ref 31.5–35.7)
MCV: 89 fL (ref 79–97)
Monocytes Absolute: 0.3 10*3/uL (ref 0.1–0.9)
Monocytes: 8 %
Neutrophils Absolute: 2.3 10*3/uL (ref 1.4–7.0)
Neutrophils: 55 %
Platelets: 267 10*3/uL (ref 150–450)
RBC: 4.55 x10E6/uL (ref 4.14–5.80)
RDW: 12.8 % (ref 11.6–15.4)
WBC: 4.1 10*3/uL (ref 3.4–10.8)

## 2020-08-17 ENCOUNTER — Telehealth: Payer: Self-pay | Admitting: Cardiology

## 2020-08-17 NOTE — Telephone Encounter (Signed)
Pt called in and stated he confused about the conversation about his lab results.  She would like to know if the nurse could call him back because he has a few more questions.  He does not understand if he is boardline and there is not changes?  He would like to know what he needs to do to improve this    Best number  [phone]

## 2020-08-20 ENCOUNTER — Ambulatory Visit: Payer: BC Managed Care – PPO | Admitting: Legal Medicine

## 2020-08-20 ENCOUNTER — Other Ambulatory Visit: Payer: Self-pay

## 2020-08-20 ENCOUNTER — Telehealth: Payer: Self-pay | Admitting: Cardiology

## 2020-08-20 ENCOUNTER — Other Ambulatory Visit: Payer: BC Managed Care – PPO

## 2020-08-20 ENCOUNTER — Encounter: Payer: Self-pay | Admitting: Legal Medicine

## 2020-08-20 VITALS — BP 130/74 | HR 91 | Resp 18 | Ht 74.0 in | Wt 204.0 lb

## 2020-08-20 DIAGNOSIS — R42 Dizziness and giddiness: Secondary | ICD-10-CM

## 2020-08-20 DIAGNOSIS — G40109 Localization-related (focal) (partial) symptomatic epilepsy and epileptic syndromes with simple partial seizures, not intractable, without status epilepticus: Secondary | ICD-10-CM

## 2020-08-20 DIAGNOSIS — R55 Syncope and collapse: Secondary | ICD-10-CM | POA: Diagnosis not present

## 2020-08-20 NOTE — Progress Notes (Signed)
Subjective:  Patient ID: Brad Zuniga, male    DOB: 1971/04/12  Age: 50 y.o. MRN: [id number]  Chief Complaint  Patient presents with  . Seizures    HPI: patient admitted to [facility] on 08/08/20.  He had a negative EEG, negative CTA, Negative CT head, sigmoidos copy neg , egd negative.They did a telehealth neurovisit. Suggesting possible sizure disorder., He was discharged on 1.27/2022.  He saw [facility] and they do not do 24 to 48 hour EEG. They need resolution so patient can rtw and drive.   Current Outpatient Medications on File Prior to Visit  Medication Sig Dispense Refill  . esomeprazole (NEXIUM) 40 MG capsule Take 40 mg by mouth daily at 12 noon.    . hydrocortisone (ANUSOL-HC) 25 MG suppository Place 1 suppository (25 mg total) rectally 2 (two) times daily. 12 suppository 1  . loratadine (CLARITIN) 10 MG tablet Take 10 mg by mouth daily.    . metoprolol tartrate (LOPRESSOR) 100 MG tablet Take 2 hours before your CT 1 tablet 0   No current facility-administered medications on file prior to visit.   Past Medical History:  Diagnosis Date  . GERD (gastroesophageal reflux disease)    History reviewed. No pertinent surgical history.  Family History  Problem Relation Age of Onset  . Heart attack Father   . Diabetes Father   . Hypertension Father   . Deep vein thrombosis Father   . Breast cancer Sister   . Heart defect Sister   . Thyroid disease Sister   . Lung cancer Maternal Grandmother   . Lung cancer Maternal Grandfather   . Heart attack Paternal Grandmother   . Lung cancer Paternal Grandfather    Social History   Socioeconomic History  . Marital status: Married    Spouse name: Not on file  . Number of children: 2  . Years of education: Not on file  . Highest education level: Associate degree: occupational, Scientist, product/process development, or vocational program  Occupational History  . 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 Smoking  status: Never Smoker  . Smokeless tobacco: Current User    Types: Snuff  Vaping Use  . Vaping Use: Never used  Substance and Sexual Activity  . Alcohol use: Not Currently  . Drug use: Never  . Sexual activity: Not on file  Other Topics Concern  . Not on file  Social History Narrative   Patient is right-handed. He lives with his wife and one of his daughters in a 2 story house. He rarely drinks caffeine now, ghe is very active at work.   Social Determinants of Health   Financial Resource Strain: Not on file  Food Insecurity: Not on file  Transportation Needs: Not on file  Physical Activity: Not on file  Stress: Not on file  Social Connections: Not on file    Review of Systems  Constitutional: Negative for activity change, appetite change and unexpected weight change.  HENT: Negative for congestion and sinus pain.   Eyes: Negative for visual disturbance.  Respiratory: Negative for chest tightness, shortness of breath and wheezing.   Cardiovascular: Negative for chest pain, palpitations and leg swelling.  Gastrointestinal: Negative for abdominal distention and abdominal pain.  Endocrine: Negative for polyuria.  Genitourinary: Negative for difficulty urinating, dysuria and urgency.  Musculoskeletal: Negative for arthralgias and back pain.  Skin: Negative.   Neurological: Negative.   Psychiatric/Behavioral: Negative.      Objective:  BP 130/74  Pulse 91   Resp 18   Ht 6\' 2"  (1.88 m)   Wt 204 lb (92.5 kg)   SpO2 98%   BMI 26.19 kg/m   BP/Weight 08/20/2020 08/15/2020 08/10/2020  Systolic BP 130 134 -  Diastolic BP 74 88 -  Wt. (Lbs) 204 204.6 195  BMI 26.19 26.27 24.37    Physical Exam Vitals reviewed.  Constitutional:      Appearance: Normal appearance.  HENT:     Right Ear: Tympanic membrane, ear canal and external ear normal.     Left Ear: Tympanic membrane and external ear normal.     Mouth/Throat:     Mouth: Mucous membranes are moist.  Eyes:      Conjunctiva/sclera: Conjunctivae normal.     Pupils: Pupils are equal, round, and reactive to light.  Cardiovascular:     Rate and Rhythm: Normal rate and regular rhythm.     Pulses: Normal pulses.     Heart sounds: Normal heart sounds.  Pulmonary:     Effort: No respiratory distress.     Breath sounds: Normal breath sounds.  Abdominal:     General: Abdomen is flat. Bowel sounds are normal. There is no distension.     Tenderness: There is no abdominal tenderness.  Musculoskeletal:        General: Normal range of motion.     Cervical back: Normal range of motion and neck supple.  Skin:    General: Skin is warm.     Capillary Refill: Capillary refill takes less than 2 seconds.  Neurological:     General: No focal deficit present.     Mental Status: He is alert and oriented to person, place, and time.     Cranial Nerves: No cranial nerve deficit.     Sensory: No sensory deficit.     Motor: No weakness.     Coordination: Coordination normal.     Gait: Gait normal.     Deep Tendon Reflexes: Reflexes normal.       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  The EEG in the hospital was negative CAT scans and CTA were negative.  They have seen [place] neurologic today but  they do not do the 48 to 72-hour ambulatory EEG which is needed.  I have called the [facility] and they do this procedure and we will set it up as quickly as possible so he may be able to get back to work. Postural dizziness with presyncope Patient is getting worked up for cardiac problems and so far they are all negative he is wearing a cardiac monitor event monitor.  I spent dedicated to the care of this patient on the date of this encounter to include face-to-face time with the patient, as well as: review all hospital records and calls to [facility] neurology  Follow-up: Return in about 2 weeks (around 09/03/2020). after neurology consult  An After Visit Summary was printed and given to the patient.  Brent Bulla, MD [facility] [phone]

## 2020-08-20 NOTE — Telephone Encounter (Signed)
Patient's wife states the patient was supposed to have a 48-72 hour ambulatory EEG to wear, but when they got to the office the tech stated they do not do those at that facility. She also states the patient was told his LDL's were on the borderline and to continue his medication, but he is not taking a medication.

## 2020-08-20 NOTE — Telephone Encounter (Signed)
Dr. Servando Salina spoke with pt on the phone and advised him to contact his neurologist.

## 2020-08-21 NOTE — Progress Notes (Addendum)
NEUROLOGY FOLLOW UP OFFICE NOTE  Tykel Badie [id number]   Subjective:  Brad Zuniga is a 50 year old right-handed male who follows up for confusion.    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  Routine awake and sleep EEG on 02/01/2018 showed shifting asymmetry of the bilateral temporal lobes during drowsiness but within normal limits.  On 08/02/2020, he was at work on a lift and looked up when he felt like he may have passed out for just a second.  He didn't fall.  On 1/23/202, he was sitting in a recliner with his head cocked leaning to the right when it occurred again for a second .  He continues to have occasional dizzy spells where he feels brief lightheadedness, but no spinning or loss of consciousness.  He followed up with his PCP and blood pressure was elevated and advised to go to the hospital.  He was admitted to [facility].  He had a dizzy spell on telemetry without changes.  No change in blood pressure.  Echocardiogram was normal.  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  MRI of brain with and without contrast was normal.  Routine EEG on 08/08/2020 again demonstrated transient slowing in the left temporal region but no epileptiform discharges or electrographic seizures.  Temporal lobe epilepsy was considered but he was not started on an antiepileptic medication.  He denies epigastric rising, phantosmia, convulsions.  He did endorse deja vu, but that was a one time situational event where he went somewhere and thought he may have been there before - it is not a recurrent episodic event or sensation.  He is currently wearing a cardiac event monitor.  He has not had a recurrence since then.  He was told not to drive until seen by me.  HISTORY: In early May 2019, he woke up one morning with vertigo, described as room spinning.  They spinning was positional, occurring when  laying down or turning his head quickly.  It would last up to a couple of minutes.  He was fine if he stayed still.  He was diagnosed with BPPV.    On 12/19/17 he had an episode of transient confusion.  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  He didn't know which buttons to press or how to move the joystick.  This was concerning because he has used this machine for years.  He also did not feel well.  He felt shaky and had cold sweats.  He needed to call somebody else to finish the job.  He felt ill for about 2 hours before symptoms resolved.  He was not amnestic to the events of that morning.  He did not have any trouble recognizing familiar faces.  He did not have any associated headache, facial droop, slurred speech, visual disturbance, bowel or bladder incontinence or unilateral numbness or weakness.  At the time, he was not overworked or dehydrated.  He was not performing and strenuous activity.  He felt relaxed.  He went to the ED the following day for further evaluation.  CT of head was normal.  MRI of brain with and without contrast was normal.  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  MRA of neck was normal.  Labs, including CBC and BMP, did not reveal infection or electrolyte abnormalities.  Troponin was negative.  EKG unremarkable and showed no acute abnormality.  His PCP reportedly checked Lyme and RMSF.  Lyme was negative.  He said that he tested positive for RMSF and was prescribed doxycycline.  He denies rash or fever.  PAST MEDICAL HISTORY: Past Medical History:  Diagnosis Date  . GERD (gastroesophageal reflux disease)     MEDICATIONS: Current Outpatient Medications on File Prior to Visit  Medication Sig Dispense Refill  . esomeprazole (NEXIUM) 40 MG capsule Take 40 mg by mouth daily at 12 noon.    . hydrocortisone (ANUSOL-HC) 25 MG suppository Place 1 suppository (25 mg total) rectally 2 (two) times daily. 12  suppository 1  . loratadine (CLARITIN) 10 MG tablet Take 10 mg by mouth daily.    . metoprolol tartrate (LOPRESSOR) 100 MG tablet Take 2 hours before your CT 1 tablet 0   No current facility-administered medications on file prior to visit.    ALLERGIES: No Known Allergies  FAMILY HISTORY: Family History  Problem Relation Age of Onset  . Heart attack Father   . Diabetes Father   . Hypertension Father   . Deep vein thrombosis Father   . Breast cancer Sister   . Heart defect Sister   . Thyroid disease Sister   . Lung cancer Maternal Grandmother   . Lung cancer Maternal Grandfather   . Heart attack Paternal Grandmother   . Lung cancer Paternal Grandfather    SOCIAL HISTORY: Social History   Socioeconomic History  . Marital status: Married    Spouse name: Not on file  . Number of children: 2  . Years of education: Not on file  . Highest education level: Associate degree: occupational, Scientist, product/process development, or vocational program  Occupational History  . 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 Smoking status: Never Smoker  . Smokeless tobacco: Current User    Types: Snuff  Vaping Use  . Vaping Use: Never used  Substance and Sexual Activity  . Alcohol use: Not Currently  . Drug use: Never  . Sexual activity: Not on file  Other Topics Concern  . Not on file  Social History Narrative   Patient is right-handed. He lives with his wife and one of his daughters in a 2 story house. He rarely drinks caffeine now, ghe is very active at work.   Social Determinants of Health   Financial Resource Strain: Not on file  Food Insecurity: Not on file  Transportation Needs: Not on file  Physical Activity: Not on file  Stress: Not on file  Social Connections: Not on file  Intimate Partner Violence: Not on file     Objective:  Blood pressure (!) 142/90, pulse 74, height 6\' 2"  (1.88 m), weight 200 lb (90.7 kg), SpO2 100 %. General: No acute  distress.  Patient appears well-groomed.   Head:  Normocephalic/atraumatic Eyes:  Fundi examined but not visualized Neck: supple, no paraspinal tenderness, full range of motion Heart:  Regular rate and rhythm Lungs:  Clear to auscultation bilaterally Back: No paraspinal tenderness Neurological Exam: alert and oriented to person, place, and time. Attention span and concentration intact, recent and remote memory intact, fund of knowledge intact.  Speech fluent and not dysarthric, language intact.  CN II-XII intact. Bulk and tone normal, muscle strength 5/5 throughout.  Sensation to light touch, temperature and vibration intact.  Deep tendon reflexes 2+ throughout, toes downgoing.  Finger to nose and heel to shin testing intact.  Gait normal, Romberg negative.  Assessment/Plan:   Dizziness/lightheadedness - endorses two brief episodes of loss of awareness - unclear etiology.  EEG reportedly showed left temporal slowing so temporal lobe epilepsy entertained.  He did exhibit bilateral shifting temporal slowing on prior EEG in 2019, which felt to be within normal limits.  Description of events is vague.  1.  We will proceed with 48 hour ambulatory EEG 2.  As for driving, all I can tell him is that [place] law states no driving for 6 months if you experience an unexplained unprovoked loss of awareness, whether seizure or other event.  He says he lost awareness for just a second, but the law doesn't specify duration.  3.  Follow up after testing.  Shon Millet, DO  CC:  Marianne Sofia, PA-C

## 2020-08-21 NOTE — Telephone Encounter (Signed)
08/21/20 Completed forms received from provider. Forms given to patient wife. Payment posted.Noreene Filbert

## 2020-08-22 ENCOUNTER — Ambulatory Visit: Payer: BC Managed Care – PPO | Admitting: Neurology

## 2020-08-22 ENCOUNTER — Encounter: Payer: Self-pay | Admitting: Neurology

## 2020-08-22 ENCOUNTER — Other Ambulatory Visit: Payer: Self-pay

## 2020-08-22 VITALS — BP 142/90 | HR 74 | Ht 74.0 in | Wt 200.0 lb

## 2020-08-22 DIAGNOSIS — R42 Dizziness and giddiness: Secondary | ICD-10-CM

## 2020-08-22 NOTE — Patient Instructions (Addendum)
1.  We will get a 48 hour ambulatory EEG 2.  As per [facility], you should not drive for 6 months from last event with loss of awareness or consciousness 3.  Follow up after testing

## 2020-08-23 ENCOUNTER — Telehealth (HOSPITAL_COMMUNITY): Payer: Self-pay | Admitting: Emergency Medicine

## 2020-08-23 NOTE — Telephone Encounter (Signed)
Reaching out to patient to offer assistance regarding upcoming cardiac imaging study; pt verbalizes understanding of appt date/time, parking situation and where to check in, pre-test NPO status and medications ordered, and verified current allergies; name and call back number provided for further questions should they arise Rockwell Alexandria RN Navigator Cardiac Imaging [facility] [phone] office [phone] cell  Pt instructed to take 100mg  metoprolol tart 2 hr prior to scan. Asked to avoid claritin and tobacco prior to scan Careli Luzader

## 2020-08-27 ENCOUNTER — Ambulatory Visit (HOSPITAL_COMMUNITY)
Admission: RE | Admit: 2020-08-27 | Discharge: 2020-08-27 | Disposition: A | Discharge status: Home / Self Care | Payer: BC Managed Care – PPO | Source: Ambulatory Visit | Attending: Cardiology | Admitting: Cardiology

## 2020-08-27 ENCOUNTER — Encounter (HOSPITAL_COMMUNITY): Payer: Self-pay

## 2020-08-27 ENCOUNTER — Other Ambulatory Visit: Payer: Self-pay

## 2020-08-27 ENCOUNTER — Encounter: Payer: BC Managed Care – PPO | Admitting: *Deleted

## 2020-08-27 DIAGNOSIS — R0789 Other chest pain: Secondary | ICD-10-CM | POA: Insufficient documentation

## 2020-08-27 DIAGNOSIS — Z006 Encounter for examination for normal comparison and control in clinical research program: Secondary | ICD-10-CM

## 2020-08-27 MED ORDER — NITROGLYCERIN 0.4 MG SL SUBL
0.8000 mg | SUBLINGUAL_TABLET | Freq: Once | SUBLINGUAL | Status: AC
  Administered 2020-08-27: 0.8 mg via SUBLINGUAL

## 2020-08-27 MED ORDER — IOHEXOL 350 MG/ML SOLN
80.0000 mL | Freq: Once | INTRAVENOUS | Status: AC | PRN
  Administered 2020-08-27: 80 mL via INTRAVENOUS

## 2020-08-27 MED ORDER — NITROGLYCERIN 0.4 MG SL SUBL
SUBLINGUAL_TABLET | SUBLINGUAL | Status: AC
  Filled 2020-08-27: qty 2

## 2020-08-27 NOTE — Research (Signed)
Subject Name: Brad Zuniga  Subject met inclusion and exclusion criteria.  The informed consent form, study requirements and expectations were reviewed with the subject and questions and concerns were addressed prior to the signing of the consent form.  The subject verbalized understanding of the trial requirements.  The subject agreed to participate in the IDENTIFY trial and signed the informed consent at 0709 on 08/27/20  The informed consent was obtained prior to performance of any protocol-specific procedures for the subject.  A copy of the signed informed consent was given to the subject and a copy was placed in the subject's medical record.   [name]

## 2020-08-31 ENCOUNTER — Telehealth: Payer: Self-pay

## 2020-08-31 NOTE — Telephone Encounter (Signed)
Error

## 2020-08-31 NOTE — Telephone Encounter (Signed)
Per my chart message letter typed and ready for pick up.

## 2020-09-03 ENCOUNTER — Telehealth: Payer: Self-pay

## 2020-09-03 NOTE — Telephone Encounter (Signed)
Spoke with patient about monitor results. Patient verbalizes understanding. No further questions or concerns at this time.

## 2020-09-03 NOTE — Telephone Encounter (Signed)
-----   Message from Thomasene Ripple, DO sent at 09/01/2020  1:37 PM EST ----- Regarding: Normal Zio results Please let the patient know that his ZIO monitor results were normal.  Did not show any significant arrhythmia.

## 2020-09-10 ENCOUNTER — Other Ambulatory Visit: Payer: Self-pay

## 2020-09-10 ENCOUNTER — Ambulatory Visit: Payer: BC Managed Care – PPO | Admitting: Neurology

## 2020-09-10 DIAGNOSIS — R42 Dizziness and giddiness: Secondary | ICD-10-CM | POA: Diagnosis not present

## 2020-09-14 NOTE — Procedures (Signed)
ELECTROENCEPHALOGRAM REPORT  Dates of Recording: 09/10/2020 at 09:16 to 09/12/2020 at 09:42  Patient's Name: Brad Zuniga MRN: [id number] Date of Birth: 18-Dec-1970  Procedure: 48-hour ambulatory EEG  History: 50 year old male with two brief episodes of loss of awareness.  Previous EEG reportedly showed left temporal slowing.  Medications:  NEXIUM) 40 MG capsule ANUSOL-HC 25 MG suppository CLARITIN 10 MG tablet LOPRESSOR 100 MG tablet  Technical Summary: This is a 48-hour multichannel digital EEG recording measured by the international 10-20 system with electrodes applied with paste and impedances below 5000 ohms performed as portable with EKG monitoring.  The digital EEG was referentially recorded, reformatted, and digitally filtered in a variety of bipolar and referential montages for optimal display.    DESCRIPTION OF RECORDING: During maximal wakefulness, the background activity consisted of a symmetric 10Hz  posterior dominant rhythm which was reactive to eye opening.  There were no epileptiform discharges or focal slowing seen in wakefulness.  During the recording, the patient progresses through wakefulness, drowsiness, and Stage 2 sleep.  Again, there were no epileptiform discharges seen.  Events:  There were no electrographic seizures seen.  EKG lead was unremarkable.  IMPRESSION: This 48-hour ambulatory EEG study is normal.    CLINICAL CORRELATION: A normal EEG does not exclude a clinical diagnosis of epilepsy.  If further clinical questions remain, inpatient video EEG monitoring may be helpful.   , DO

## 2020-09-19 ENCOUNTER — Ambulatory Visit: Payer: BC Managed Care – PPO | Admitting: Physician Assistant

## 2020-09-28 ENCOUNTER — Other Ambulatory Visit: Payer: Self-pay

## 2020-09-28 ENCOUNTER — Ambulatory Visit (INDEPENDENT_AMBULATORY_CARE_PROVIDER_SITE_OTHER): Payer: BC Managed Care – PPO | Admitting: Physician Assistant

## 2020-09-28 ENCOUNTER — Encounter: Payer: Self-pay | Admitting: Physician Assistant

## 2020-09-28 VITALS — BP 138/92 | HR 84 | Temp 96.6°F | Ht 74.0 in | Wt 205.0 lb

## 2020-09-28 DIAGNOSIS — E782 Mixed hyperlipidemia: Secondary | ICD-10-CM | POA: Diagnosis not present

## 2020-09-28 DIAGNOSIS — R5383 Other fatigue: Secondary | ICD-10-CM

## 2020-09-28 DIAGNOSIS — I10 Essential (primary) hypertension: Secondary | ICD-10-CM | POA: Diagnosis not present

## 2020-09-28 MED ORDER — LISINOPRIL 5 MG PO TABS: 5.0000 mg | ORAL_TABLET | Freq: Every day | ORAL | 0 refills | Status: DC

## 2020-09-28 NOTE — Progress Notes (Signed)
Subjective:  Patient ID: Brad Zuniga, male    DOB: May 18, 1971  Age: 50 y.o. MRN: [id number]  Chief Complaint  Patient presents with  . Hypertension    HPI  pt states that he is here for blood pressure elevated - the past several times he has had it check it has been elevated and he has been very fatigued He has had an extensive cardiac and neurological workup in the past 2 months which has been negative - he had this done after having an episode of 'blacking out' Pt states he has not had any further symptoms like that but still feeling drained He is curious to know although he had negative COVID testing if he actually had it and interested in antibody testing  Pt had been on testosterone in the past - would like level checked as well  Current Outpatient Medications on File Prior to Visit  Medication Sig Dispense Refill  . esomeprazole (NEXIUM) 40 MG capsule Take 40 mg by mouth daily at 12 noon.    . loratadine (CLARITIN) 10 MG tablet Take 10 mg by mouth daily.     No current facility-administered medications on file prior to visit.   Past Medical History:  Diagnosis Date  . GERD (gastroesophageal reflux disease)    History reviewed. No pertinent surgical history.  Family History  Problem Relation Age of Onset  . Heart attack Father   . Diabetes Father   . Hypertension Father   . Deep vein thrombosis Father   . Breast cancer Sister   . Heart defect Sister   . Thyroid disease Sister   . Lung cancer Maternal Grandmother   . Lung cancer Maternal Grandfather   . Heart attack Paternal Grandmother   . Lung cancer Paternal Grandfather    Social History   Socioeconomic History  . Marital status: Married    Spouse name: Not on file  . Number of children: 2  . Years of education: Not on file  . Highest education level: Associate degree: occupational, Scientist, product/process development, or vocational program  Occupational History  . Occupation: Electrical engineer: Arboriculturist  Tobacco Use  . Smoking status: Never Smoker  . Smokeless tobacco: Current User    Types: Snuff  Vaping Use  . Vaping Use: Never used  Substance and Sexual Activity  . Alcohol use: Not Currently  . Drug use: Never  . Sexual activity: Not on file  Other Topics Concern  . Not on file  Social History Narrative   Patient is right-handed. He lives with his wife and one of his daughters in a 2 story house. He rarely drinks caffeine now, ghe is very active at work.   Social Determinants of Health   Financial Resource Strain: Not on file  Food Insecurity: Not on file  Transportation Needs: Not on file  Physical Activity: Not on file  Stress: Not on file  Social Connections: Not on file    Review of Systems CONSTITUTIONAL: see HPI E/N/T: Negative for ear pain, nasal congestion and sore throat.  CARDIOVASCULAR: Negative for chest pain, dizziness, palpitations and pedal edema.  RESPIRATORY: Negative for recent cough and dyspnea.  GASTROINTESTINAL: Negative for abdominal pain, acid reflux symptoms, constipation, diarrhea, nausea and vomiting.  MSK: Negative for arthralgias and myalgias.  INTEGUMENTARY: Negative for rash.  NEUROLOGICAL: Negative for dizziness and headaches.  PSYCHIATRIC: Negative for sleep disturbance and to question depression screen.  Negative for depression, negative for anhedonia.  Objective:  BP (!) 138/92 (BP Location: Left Arm, Patient Position: Sitting, Cuff Size: Large)   Pulse 84   Temp (!) 96.6 F (35.9 C) (Temporal)   Ht 6\' 2"  (1.88 m)   Wt 205 lb (93 kg)   SpO2 99%   BMI 26.32 kg/m   BP/Weight 09/28/2020 08/27/2020 08/22/2020  Systolic BP 138 106 142  Diastolic BP 92 70 90  Wt. (Lbs) 205 - 200  BMI 26.32 - 25.68    Physical Exam PHYSICAL EXAM:   VS: BP (!) 138/92 (BP Location: Left Arm, Patient Position: Sitting, Cuff Size: Large)   Pulse 84   Temp (!) 96.6 F (35.9 C) (Temporal)   Ht 6\' 2"  (1.88 m)   Wt 205 lb (93 kg)    SpO2 99%   BMI 26.32 kg/m   GEN: Well nourished, well developed, in no acute distress  Cardiac: RRR; no murmurs, rubs, or gallops,no edema -  Respiratory:  normal respiratory rate and pattern with no distress - normal breath sounds with no rales, rhonchi, wheezes or rubs Skin: warm and dry, no rash  Neuro:  Alert and Oriented x 3,  - CN II-Xii grossly intact Psych: euthymic mood, appropriate affect and demeanor  Diabetic Foot Exam - Simple   No data filed      Lab Results  Component Value Date   WBC 4.1 08/15/2020   HGB 13.4 08/15/2020   HCT 40.5 08/15/2020   PLT 267 08/15/2020   GLUCOSE 110 (H) 08/15/2020   CHOL 169 08/15/2020   TRIG 74 08/15/2020   HDL 39 (L) 08/15/2020   LDLCALC 116 (H) 08/15/2020   ALT 13 09/15/2019   AST 15 09/15/2019   NA 139 08/15/2020   K 4.1 08/15/2020   CL 102 08/15/2020   CREATININE 1.01 08/15/2020   BUN 15 08/15/2020   CO2 28 08/15/2020   INR 1.03 12/20/2017      Assessment & Plan:   1. Other fatigue - TSH - VITAMIN D 25 Hydroxy (Vit-D Deficiency, Fractures) - Iron, TIBC and Ferritin Panel - B12 and Folate Panel - Testosterone - SARS-CoV-2 Antibody, IgM  2. Benign hypertension - CBC with Differential/Platelet - Comprehensive metabolic panel - TSH - lisinopril (ZESTRIL) 5 MG tablet; Take 1 tablet (5 mg total) by mouth daily.  Dispense: 90 tablet; Refill: 0  3. Mixed hyperlipidemia - CBC with Differential/Platelet - Comprehensive metabolic panel  recent labwork showed LDL at 116 - recommend to watch fried/fatty foods  Meds ordered this encounter  Medications  . lisinopril (ZESTRIL) 5 MG tablet    Sig: Take 1 tablet (5 mg total) by mouth daily.    Dispense:  90 tablet    Refill:  0    Order Specific Question:   Supervising Provider    Answer04/08/2020    Orders Placed This Encounter  Procedures  . CBC with Differential/Platelet  . Comprehensive metabolic panel  . TSH  . VITAMIN D 25 Hydroxy (Vit-D  Deficiency, Fractures)  . Iron, TIBC and Ferritin Panel  . B12 and Folate Panel  . Testosterone  . SARS-CoV-2 Antibody, IgM      Follow-up: Return in about 4 weeks (around 10/26/2020) for nurse visit bp check.  An After Visit Summary was printed and given to the patient.  Corey Harold [facility] [phone]

## 2020-09-28 NOTE — Addendum Note (Signed)
Addended byMarianne Sofia on: 09/28/2020 09:27 AM   Modules accepted: Orders

## 2020-09-29 LAB — CBC WITH DIFFERENTIAL/PLATELET
Basophils Absolute: 0 10*3/uL (ref 0.0–0.2)
Basos: 1 %
EOS (ABSOLUTE): 0.2 10*3/uL (ref 0.0–0.4)
Eos: 5 %
Hematocrit: 41.3 % (ref 37.5–51.0)
Hemoglobin: 13.4 g/dL (ref 13.0–17.7)
Immature Grans (Abs): 0 10*3/uL (ref 0.0–0.1)
Immature Granulocytes: 0 %
Lymphocytes Absolute: 1.2 10*3/uL (ref 0.7–3.1)
Lymphs: 31 %
MCH: 29.5 pg (ref 26.6–33.0)
MCHC: 32.4 g/dL (ref 31.5–35.7)
MCV: 91 fL (ref 79–97)
Monocytes Absolute: 0.4 10*3/uL (ref 0.1–0.9)
Monocytes: 9 %
Neutrophils Absolute: 2.2 10*3/uL (ref 1.4–7.0)
Neutrophils: 54 %
Platelets: 225 10*3/uL (ref 150–450)
RBC: 4.54 x10E6/uL (ref 4.14–5.80)
RDW: 12.5 % (ref 11.6–15.4)
WBC: 4 10*3/uL (ref 3.4–10.8)

## 2020-09-29 LAB — COMPREHENSIVE METABOLIC PANEL
ALT: 13 IU/L (ref 0–44)
AST: 18 IU/L (ref 0–40)
Albumin/Globulin Ratio: 2.2 (ref 1.2–2.2)
Albumin: 4.6 g/dL (ref 4.0–5.0)
Alkaline Phosphatase: 64 IU/L (ref 44–121)
BUN/Creatinine Ratio: 14 (ref 9–20)
BUN: 14 mg/dL (ref 6–24)
Bilirubin Total: 0.2 mg/dL (ref 0.0–1.2)
CO2: 22 mmol/L (ref 20–29)
Calcium: 9.3 mg/dL (ref 8.7–10.2)
Chloride: 102 mmol/L (ref 96–106)
Creatinine, Ser: 1.01 mg/dL (ref 0.76–1.27)
Globulin, Total: 2.1 g/dL (ref 1.5–4.5)
Glucose: 107 mg/dL — ABNORMAL HIGH (ref 65–99)
Potassium: 4.5 mmol/L (ref 3.5–5.2)
Sodium: 139 mmol/L (ref 134–144)
Total Protein: 6.7 g/dL (ref 6.0–8.5)
eGFR: 91 mL/min/{1.73_m2} (ref >59)

## 2020-09-29 LAB — TSH: TSH: 2.01 u[IU]/mL (ref 0.450–4.500)

## 2020-09-29 LAB — TESTOSTERONE: Testosterone: 403 ng/dL (ref 264–916)

## 2020-09-29 LAB — IRON,TIBC AND FERRITIN PANEL
Ferritin: 23 ng/mL — ABNORMAL LOW (ref 30–400)
Iron Saturation: 12 % — ABNORMAL LOW (ref 15–55)
Iron: 41 ug/dL (ref 38–169)
Total Iron Binding Capacity: 350 ug/dL (ref 250–450)
UIBC: 309 ug/dL (ref 111–343)

## 2020-09-29 LAB — SAR COV2 SEROLOGY (COVID19)AB(IGG),IA
SARS-CoV-2 Semi-Quant IgG Ab: <13 AU/mL (ref <13.0)
SARS-CoV-2 Spike Ab Interp: NEGATIVE

## 2020-09-29 LAB — B12 AND FOLATE PANEL
Folate: 10.6 ng/mL (ref >3.0)
Vitamin B-12: 332 pg/mL (ref 232–1245)

## 2020-09-29 LAB — VITAMIN D 25 HYDROXY (VIT D DEFICIENCY, FRACTURES): Vit D, 25-Hydroxy: 33.1 ng/mL (ref 30.0–100.0)

## 2020-10-26 ENCOUNTER — Other Ambulatory Visit: Payer: Self-pay

## 2020-10-26 ENCOUNTER — Ambulatory Visit: Payer: BC Managed Care – PPO

## 2020-10-26 VITALS — BP 122/72 | HR 73 | Resp 18

## 2020-10-26 DIAGNOSIS — I10 Essential (primary) hypertension: Secondary | ICD-10-CM

## 2020-10-26 NOTE — Progress Notes (Signed)
Pt in office for BP check. Pt states he has been taking allergy medications. Dayquil, nyquil, and mucinex spray. Pt picked up xyzal and will begin taking that for his allergies instead of claritin as it did not help him. Pt states he has been feeling great. Pts BP today was stable. Will send chart to provider as FYI.   Lorita Officer, CCMA 10/26/20 9:05 AM

## 2020-11-13 ENCOUNTER — Encounter: Payer: Self-pay | Admitting: Cardiology

## 2020-11-13 ENCOUNTER — Other Ambulatory Visit: Payer: Self-pay

## 2020-11-13 ENCOUNTER — Ambulatory Visit (INDEPENDENT_AMBULATORY_CARE_PROVIDER_SITE_OTHER): Payer: BC Managed Care – PPO | Admitting: Cardiology

## 2020-11-13 VITALS — BP 126/80 | HR 78 | Ht 74.0 in | Wt 202.0 lb

## 2020-11-13 DIAGNOSIS — R42 Dizziness and giddiness: Secondary | ICD-10-CM

## 2020-11-13 DIAGNOSIS — I10 Essential (primary) hypertension: Secondary | ICD-10-CM | POA: Diagnosis not present

## 2020-11-13 NOTE — Patient Instructions (Signed)

## 2020-11-13 NOTE — Progress Notes (Signed)
Cardiology Office Note:    Date:  11/13/2020   ID:  Brad Zuniga, DOB 1971-06-20, MRN [id number]  PCP:  Marianne Sofia, PA-C  Cardiologist:  Thomasene Ripple, DO  Electrophysiologist:  None   Referring MD: Marianne Sofia, PA-C   I am doing fine  History of Present Illness:    Brad Zuniga is a 50 y.o. male with a hx of hypertension, is here today for follow-up visit.  The patient presented initially after his hospitalization at [facility] for presyncope episodes.  He was worked up no stroke.  We did place a monitor on the patient with no arrhythmias.  He complained of chest pain did a coronary CTA with 0 calcium no evidence of coronary artery disease.  Today he tells me he has been experiencing intermittent dizziness.  He describes this as he was working he bent his head down doing some things and when he lifted up he got so dizzy he could not due to his work he had to sit down.  This happened intermittently/occasionally.  Past Medical History:  Diagnosis Date  . GERD (gastroesophageal reflux disease)     History reviewed. No pertinent surgical history.  Current Medications: Current Meds  Medication Sig  . esomeprazole (NEXIUM) 40 MG capsule Take 40 mg by mouth daily at 12 noon.  Marland Kitchen levocetirizine (XYZAL) 2.5 MG/5ML solution Take 2.5 mg by mouth every evening.  Marland Kitchen lisinopril (ZESTRIL) 5 MG tablet Take 1 tablet (5 mg total) by mouth daily.     Allergies:   Patient has no known allergies.   Social History   Socioeconomic History  . Marital status: Married    Spouse name: Not on file  . Number of children: 2  . Years of education: Not on file  . Highest education level: Associate degree: occupational, Scientist, product/process development, or vocational program  Occupational History  . 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 Smoking status: Never Smoker  . Smokeless tobacco: Current User    Types: Snuff  Vaping Use  . Vaping Use: Never used  Substance and  Sexual Activity  . Alcohol use: Not Currently  . Drug use: Never  . Sexual activity: Not on file  Other Topics Concern  . Not on file  Social History Narrative   Patient is right-handed. He lives with his wife and one of his daughters in a 2 story house. He rarely drinks caffeine now, ghe is very active at work.   Social Determinants of Health   Financial Resource Strain: Not on file  Food Insecurity: Not on file  Transportation Needs: Not on file  Physical Activity: Not on file  Stress: Not on file  Social Connections: Not on file     Family History: The patient's family history includes Breast cancer in his sister; Deep vein thrombosis in his father; Diabetes in his father; Heart attack in his father and paternal grandmother; Heart defect in his sister; Hypertension in his father; Lung cancer in his maternal grandfather, maternal grandmother, and paternal grandfather; Thyroid disease in his sister.  ROS:   Review of Systems  Constitution: Negative for decreased appetite, fever and weight gain.  HENT: Negative for congestion, ear discharge, hoarse voice and sore throat.   Eyes: Negative for discharge, redness, vision loss in right eye and visual halos.  Cardiovascular: Negative for chest pain, dyspnea on exertion, leg swelling, orthopnea and palpitations.  Respiratory: Negative for cough, hemoptysis, shortness of breath and snoring.   Endocrine:  Negative for heat intolerance and polyphagia.  Hematologic/Lymphatic: Negative for bleeding problem. Does not bruise/bleed easily.  Skin: Negative for flushing, nail changes, rash and suspicious lesions.  Musculoskeletal: Negative for arthritis, joint pain, muscle cramps, myalgias, neck pain and stiffness.  Gastrointestinal: Negative for abdominal pain, bowel incontinence, diarrhea and excessive appetite.  Genitourinary: Negative for decreased libido, genital sores and incomplete emptying.  Neurological: Negative for brief paralysis, focal  weakness, headaches and loss of balance.  Psychiatric/Behavioral: Negative for altered mental status, depression and suicidal ideas.  Allergic/Immunologic: Negative for HIV exposure and persistent infections.    EKGs/Labs/Other Studies Reviewed:    The following studies were reviewed today:   EKG: None today  Zio monitor  The patient wore the monitor for 13 days 22 hours starting August 10, 2020. Indication: Dizziness  The minimum heart rate was 43 bpm, maximum heart rate was 147 bpm, and average heart rate was 73 bpm. Predominant underlying rhythm was Sinus Rhythm.   Premature atrial complexes were rare less than 1%. Premature Ventricular complexes were rare less than 1%.  No ventricular tachycardia, no pauses, no supraventricular tachycardia, no AV block and no atrial fibrillation present.  11 patient triggered events associated with sinus rhythm and 6 diary events premature atrial complex in sinus rhythm.   Conclusion: Normal/unremarkable study  Coronary CT scan August 27, 2020 IMPRESSION: 1. Coronary calcium score of 0. This was 0 percentile for age and sex matched control.  2. Normal coronary origin with right dominance.  3. No evidence of CAD.  Thomasene RippleKardie Kasmira Cacioppo, DO Echocardiogram done on August 08, 2019 showed evidence of EF-normal 55 to 60%.  Right ventricle is normal size and function.  Left atrium normal size.  Right atrium normal size.  Aortic valve is trileaflet with no stenosis or regurgitation.  There is trace mitral vegetation.  Trace tricuspid regurgitation.  Aortic root aortic arch ascending proximal aorta was all within normal limits.  MRI of the brain unremarkable with mild paraspinal sinuses.  EEG done inpatient showed no clear epileptiform activity was noted and therefore no definite evidence was told of a seizure disorder.  Slow transit noted in the left temporal area indicated slight focal neurophysiologic disturbance in the left temporal  region.  CT scan of the head unremarkable noncontrast CT appearance of the brain no evidence of any acute intracranial abnormalities. CT scan of the neck the carotid artery, internal carotid and vertebral arteries are patent with the neck without stenosis.  No significant atherosclerosis disease.  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   Recent Labs: 09/28/2020: ALT 13; BUN 14; Creatinine, Ser 1.01; Hemoglobin 13.4; Platelets 225; Potassium 4.5; Sodium 139; TSH 2.010  Recent Lipid Panel    Component Value Date/Time   CHOL 169 08/15/2020 0920   TRIG 74 08/15/2020 0920   HDL 39 (L) 08/15/2020 0920   CHOLHDL 4.3 08/15/2020 0920   LDLCALC 116 (H) 08/15/2020 0920    Physical Exam:    VS:  BP 126/80   Pulse 78   Ht 6\' 2"  (1.88 m)   Wt 202 lb (91.6 kg)   SpO2 98%   BMI 25.94 kg/m     Wt Readings from Last 3 Encounters:  11/13/20 202 lb (91.6 kg)  09/28/20 205 lb (93 kg)  08/22/20 200 lb (90.7 kg)     GEN: Well nourished, well developed in no acute distress HEENT: Normal NECK: No JVD; No carotid bruits LYMPHATICS: No lymphadenopathy CARDIAC:  S1S2 noted,RRR, no murmurs, rubs, gallops RESPIRATORY:  Clear to auscultation without rales, wheezing or rhonchi  ABDOMEN: Soft, non-tender, non-distended, +bowel sounds, no guarding. EXTREMITIES: No edema, No cyanosis, no clubbing MUSCULOSKELETAL:  No deformity  SKIN: Warm and dry NEUROLOGIC:  Alert and oriented x 3, non-focal PSYCHIATRIC:  Normal affect, good insight  ASSESSMENT:    1. Dizziness   2. Hypertension, unspecified type    PLAN:     His blood pressure is acceptable we will continue patient his current dose of lisinopril 10 mg daily.  He still is experiencing intermittent dizziness.  I do think that the patient need to be evaluated by ENT.  We will send referral.  At this time he has had a comprehensive cardiovascular work-up.  No further cardiovascular  work-up is indicated at this time.  The patient is in agreement with the above plan. The patient left the office in stable condition.  The patient will follow up in   Medication Adjustments/Labs and Tests Ordered: Current medicines are reviewed at length with the patient today.  Concerns regarding medicines are outlined above.  Orders Placed This Encounter  Procedures  . Ambulatory referral to ENT   No orders of the defined types were placed in this encounter.   Patient Instructions  Medication Instructions:  Your physician recommends that you continue on your current medications as directed. Please refer to the Current Medication list given to you today.  *If you need a refill on your cardiac medications before your next appointment, please call your pharmacy*   Lab Work: None If you have labs (blood work) drawn today and your tests are completely normal, you will receive your results only by: Marland Kitchen MyChart Message (if you have MyChart) OR . A paper copy in the mail If you have any lab test that is abnormal or we need to change your treatment, we will call you to review the results.   Testing/Procedures: None   Follow-Up: At [facility], you and your health needs are our priority.  As part of our continuing mission to provide you with exceptional heart care, we have created designated Provider Care Teams.  These Care Teams include your primary Cardiologist (physician) and Advanced Practice Providers (APPs -  Physician Assistants and Nurse Practitioners) who all work together to provide you with the care you need, when you need it.  We recommend signing up for the patient portal called "MyChart".  Sign up information is provided on this After Visit Summary.  MyChart is used to connect with patients for Virtual Visits (Telemedicine).  Patients are able to view lab/test results, encounter notes, upcoming appointments, etc.  Non-urgent messages can be sent to your provider as well.    To learn more about what you can do with MyChart, go to [email or web address].    Your next appointment:   As needed  The format for your next appointment:   In Person  Provider:   Thomasene Ripple, DO   Other Instructions      Adopting a Healthy Lifestyle.  Know what a healthy weight is for you (roughly BMI <25) and aim to maintain this   Aim for 7+ servings of fruits and vegetables daily   65-80+ fluid ounces of water or unsweet tea for healthy kidneys   Limit to max 1 drink of alcohol per day; avoid smoking/tobacco   Limit animal fats in diet for cholesterol and heart health - choose grass fed whenever available   Avoid highly processed foods, and foods  high in saturated/trans fats   Aim for low stress - take time to unwind and care for your mental health   Aim for 150 min of moderate intensity exercise weekly for heart health, and weights twice weekly for bone health   Aim for 7-9 hours of sleep daily   When it comes to diets, agreement about the perfect plan isnt easy to find, even among the experts. Experts at the [facility] [employer] developed an idea known as the Healthy Eating Plate. Just imagine a plate divided into logical, healthy portions.   The emphasis is on diet quality:   Load up on vegetables and fruits - one-half of your plate: Aim for color and variety, and remember that potatoes dont count.   Go for whole grains - one-quarter of your plate: Whole wheat, barley, wheat berries, quinoa, oats, brown rice, and foods made with them. If you want pasta, go with whole wheat pasta.   Protein power - one-quarter of your plate: Fish, chicken, beans, and nuts are all healthy, versatile protein sources. Limit red meat.   The diet, however, does go beyond the plate, offering a few other suggestions.   Use healthy plant oils, such as olive, canola, soy, corn, sunflower and peanut. Check the labels, and avoid partially hydrogenated oil, which have  unhealthy trans fats.   If youre thirsty, drink water. Coffee and tea are good in moderation, but skip sugary drinks and limit milk and dairy products to one or two daily servings.   The type of carbohydrate in the diet is more important than the amount. Some sources of carbohydrates, such as vegetables, fruits, whole grains, and beans-are healthier than others.   Finally, stay active  Signed, Thomasene Ripple, DO  11/13/2020 9:36 AM    [facility]

## 2020-12-11 ENCOUNTER — Telehealth: Payer: Self-pay

## 2020-12-11 DIAGNOSIS — Z006 Encounter for examination for normal comparison and control in clinical research program: Secondary | ICD-10-CM

## 2020-12-11 NOTE — Telephone Encounter (Signed)
Called patient for 90 day Identify phone call no answer, I left a voicemail stating the intent of the phone call and our call back number to be reached in our department. 

## 2020-12-22 DIAGNOSIS — S51052A Open bite, left elbow, initial encounter: Secondary | ICD-10-CM | POA: Diagnosis not present

## 2020-12-22 DIAGNOSIS — W540XXA Bitten by dog, initial encounter: Secondary | ICD-10-CM | POA: Diagnosis not present

## 2020-12-22 DIAGNOSIS — M7989 Other specified soft tissue disorders: Secondary | ICD-10-CM | POA: Diagnosis not present

## 2020-12-22 DIAGNOSIS — S51852A Open bite of left forearm, initial encounter: Secondary | ICD-10-CM | POA: Diagnosis not present

## 2020-12-23 DIAGNOSIS — M7989 Other specified soft tissue disorders: Secondary | ICD-10-CM | POA: Diagnosis not present

## 2020-12-23 DIAGNOSIS — S51052A Open bite, left elbow, initial encounter: Secondary | ICD-10-CM | POA: Diagnosis not present

## 2020-12-24 ENCOUNTER — Other Ambulatory Visit: Payer: Self-pay | Admitting: Physician Assistant

## 2020-12-24 DIAGNOSIS — I10 Essential (primary) hypertension: Secondary | ICD-10-CM

## 2021-01-31 ENCOUNTER — Encounter: Payer: Self-pay | Admitting: Physician Assistant

## 2021-01-31 ENCOUNTER — Telehealth (INDEPENDENT_AMBULATORY_CARE_PROVIDER_SITE_OTHER): Payer: BC Managed Care – PPO | Admitting: Physician Assistant

## 2021-01-31 VITALS — Ht 74.0 in | Wt 202.0 lb

## 2021-01-31 DIAGNOSIS — J40 Bronchitis, not specified as acute or chronic: Secondary | ICD-10-CM | POA: Diagnosis not present

## 2021-01-31 DIAGNOSIS — U071 COVID-19: Secondary | ICD-10-CM

## 2021-01-31 MED ORDER — AZITHROMYCIN 250 MG PO TABS: ORAL_TABLET | ORAL | 0 refills | Status: AC

## 2021-01-31 MED ORDER — PREDNISONE 20 MG PO TABS: ORAL_TABLET | ORAL | 0 refills | Status: AC

## 2021-01-31 NOTE — Progress Notes (Signed)
Virtual Visit via Telephone Note   This visit type was conducted due to national recommendations for restrictions regarding the COVID-19 Pandemic (e.g. social distancing) in an effort to limit this patient's exposure and mitigate transmission in our community.  Due to his co-morbid illnesses, this patient is at least at moderate risk for complications without adequate follow up.  This format is felt to be most appropriate for this patient at this time.  The patient did not have access to video technology/had technical difficulties with video requiring transitioning to audio format only (telephone).  All issues noted in this document were discussed and addressed.  No physical exam could be performed with this format.  Patient verbally consented to a telehealth visit.   Date:  01/31/2021   ID:  Brad Zuniga, DOB 11/22/1970, MRN [id number]  Patient Location: Home Provider Location: Office  PCP:  Marianne Sofia, PA-C    Chief Complaint:  COVID with cough and congestion  History of Present Illness:    Brad Zuniga is a 50 y.o. male with history of COVID - wife with same symptoms and diagnosed with COVID 6 days ago - states he has had low grade fever, cough, congestion and body aches - over the past few days cough has become more productive and feels like congestion is 'settling' in his chest - denies wheezing   The patient does have symptoms concerning for COVID-19 infection (fever, chills, cough, or new shortness of breath).    Past Medical History:  Diagnosis Date   GERD (gastroesophageal reflux disease)    History reviewed. No pertinent surgical history.   Current Meds  Medication Sig   azithromycin (ZITHROMAX) 250 MG tablet Take 2 tablets on day 1, then 1 tablet daily on days 2 through 5   esomeprazole (NEXIUM) 40 MG capsule Take 40 mg by mouth daily at 12 noon.   lisinopril (ZESTRIL) 5 MG tablet TAKE 1 TABLET BY MOUTH DAILY.   predniSONE (DELTASONE) 20 MG tablet Take 3 tablets (60 mg  total) by mouth daily with breakfast for 3 days, THEN 2 tablets (40 mg total) daily with breakfast for 3 days, THEN 1 tablet (20 mg total) daily with breakfast for 3 days.     Allergies:   Patient has no known allergies.   Social History   Tobacco Use   Smoking status: Never   Smokeless tobacco: Current    Types: Snuff  Vaping Use   Vaping Use: Never used  Substance Use Topics   Alcohol use: Not Currently   Drug use: Never     Family Hx: The patient's family history includes Breast cancer in his sister; Deep vein thrombosis in his father; Diabetes in his father; Heart attack in his father and paternal grandmother; Heart defect in his sister; Hypertension in his father; Lung cancer in his maternal grandfather, maternal grandmother, and paternal grandfather; Thyroid disease in his sister.  ROS:   Please see the history of present illness.    All other systems reviewed and are negative.  Labs/Other Tests and Data Reviewed:    Recent Labs: 09/28/2020: ALT 13; BUN 14; Creatinine, Ser 1.01; Hemoglobin 13.4; Platelets 225; Potassium 4.5; Sodium 139; TSH 2.010   Recent Lipid Panel Lab Results  Component Value Date/Time   CHOL 169 08/15/2020 09:20 AM   TRIG 74 08/15/2020 09:20 AM   HDL 39 (L) 08/15/2020 09:20 AM   CHOLHDL 4.3 08/15/2020 09:20 AM   LDLCALC 116 (H) 08/15/2020 09:20 AM    Wt Readings from  Last 3 Encounters:  01/31/21 202 lb (91.6 kg)  11/13/20 202 lb (91.6 kg)  09/28/20 205 lb (93 kg)     Objective:    Vital Signs:  Ht 6\' 2"  (1.88 m)   Wt 202 lb (91.6 kg)   BMI 25.94 kg/m    VITAL SIGNS:  reviewed GEN:  no acute distress - appears like breathing normal without trouble  ASSESSMENT & PLAN:    COVID 19 with secondary bronchitis --- rx for zpack and prednisone given - recommend to continue rest, fluids, tylenol --- follow up if any symptoms change or worsen  COVID-19 Education: The signs and symptoms of COVID-19 were discussed with the patient and how to  seek care for testing (follow up with PCP or arrange E-visit). The importance of social distancing was discussed today.  Time:   Today, I have spent 10 minutes with the patient with telehealth technology discussing the above problems.     Medication Adjustments/Labs and Tests Ordered: Current medicines are reviewed at length with the patient today.  Concerns regarding medicines are outlined above.   Tests Ordered: No orders of the defined types were placed in this encounter.   Medication Changes: Meds ordered this encounter  Medications   azithromycin (ZITHROMAX) 250 MG tablet    Sig: Take 2 tablets on day 1, then 1 tablet daily on days 2 through 5    Dispense:  6 tablet    Refill:  0    Order Specific Question:   Supervising Provider    Answer:     predniSONE (DELTASONE) 20 MG tablet    Sig: Take 3 tablets (60 mg total) by mouth daily with breakfast for 3 days, THEN 2 tablets (40 mg total) daily with breakfast for 3 days, THEN 1 tablet (20 mg total) daily with breakfast for 3 days.    Dispense:  18 tablet    Refill:  0    Order Specific Question:   Supervising Provider    AnswerCorey Harold    Follow Up:  In Person prn  Signed, Corey Harold, PA-C  01/31/2021 8:45 AM    [facility]

## 2021-02-04 ENCOUNTER — Encounter: Payer: Self-pay | Admitting: Physician Assistant

## 2021-02-18 ENCOUNTER — Ambulatory Visit (INDEPENDENT_AMBULATORY_CARE_PROVIDER_SITE_OTHER): Payer: BC Managed Care – PPO | Admitting: Nurse Practitioner

## 2021-02-18 ENCOUNTER — Encounter: Payer: Self-pay | Admitting: Nurse Practitioner

## 2021-02-18 VITALS — BP 108/82 | HR 62 | Resp 18 | Ht 74.0 in | Wt 191.0 lb

## 2021-02-18 DIAGNOSIS — R519 Headache, unspecified: Secondary | ICD-10-CM

## 2021-02-18 DIAGNOSIS — R7301 Impaired fasting glucose: Secondary | ICD-10-CM | POA: Diagnosis not present

## 2021-02-18 DIAGNOSIS — R5382 Chronic fatigue, unspecified: Secondary | ICD-10-CM | POA: Diagnosis not present

## 2021-02-18 DIAGNOSIS — J329 Chronic sinusitis, unspecified: Secondary | ICD-10-CM | POA: Diagnosis not present

## 2021-02-18 DIAGNOSIS — R634 Abnormal weight loss: Secondary | ICD-10-CM | POA: Diagnosis not present

## 2021-02-18 DIAGNOSIS — U099 Post covid-19 condition, unspecified: Secondary | ICD-10-CM | POA: Diagnosis not present

## 2021-02-18 LAB — CBC WITH DIFFERENTIAL/PLATELET
Basophils Absolute: 0 10*3/uL (ref 0.0–0.2)
Basos: 1 %
EOS (ABSOLUTE): 0.1 10*3/uL (ref 0.0–0.4)
Eos: 2 %
Hematocrit: 40.1 % (ref 37.5–51.0)
Hemoglobin: 13.5 g/dL (ref 13.0–17.7)
Immature Grans (Abs): 0 10*3/uL (ref 0.0–0.1)
Immature Granulocytes: 0 %
Lymphocytes Absolute: 1.1 10*3/uL (ref 0.7–3.1)
Lymphs: 28 %
MCH: 29.7 pg (ref 26.6–33.0)
MCHC: 33.7 g/dL (ref 31.5–35.7)
MCV: 88 fL (ref 79–97)
Monocytes Absolute: 0.3 10*3/uL (ref 0.1–0.9)
Monocytes: 9 %
Neutrophils Absolute: 2.4 10*3/uL (ref 1.4–7.0)
Neutrophils: 60 %
Platelets: 250 10*3/uL (ref 150–450)
RBC: 4.55 x10E6/uL (ref 4.14–5.80)
RDW: 13.2 % (ref 11.6–15.4)
WBC: 3.9 10*3/uL (ref 3.4–10.8)

## 2021-02-18 LAB — COMPREHENSIVE METABOLIC PANEL
ALT: 12 IU/L (ref 0–44)
AST: 14 IU/L (ref 0–40)
Albumin/Globulin Ratio: 1.7 (ref 1.2–2.2)
Albumin: 4.3 g/dL (ref 4.0–5.0)
Alkaline Phosphatase: 62 IU/L (ref 44–121)
BUN/Creatinine Ratio: 10 (ref 9–20)
BUN: 11 mg/dL (ref 6–24)
Bilirubin Total: 0.7 mg/dL (ref 0.0–1.2)
CO2: 24 mmol/L (ref 20–29)
Calcium: 9.1 mg/dL (ref 8.7–10.2)
Chloride: 101 mmol/L (ref 96–106)
Creatinine, Ser: 1.05 mg/dL (ref 0.76–1.27)
Globulin, Total: 2.5 g/dL (ref 1.5–4.5)
Glucose: 107 mg/dL — ABNORMAL HIGH (ref 65–99)
Potassium: 4.4 mmol/L (ref 3.5–5.2)
Sodium: 138 mmol/L (ref 134–144)
Total Protein: 6.8 g/dL (ref 6.0–8.5)
eGFR: 86 mL/min/{1.73_m2} (ref >59)

## 2021-02-18 MED ORDER — FLUTICASONE PROPIONATE 50 MCG/ACT NA SUSP: 2.0000 | Freq: Every day | NASAL | 6 refills | Status: DC

## 2021-02-18 MED ORDER — AMOXICILLIN 875 MG PO TABS: 875.0000 mg | ORAL_TABLET | Freq: Two times a day (BID) | ORAL | 0 refills | Status: AC

## 2021-02-18 NOTE — Patient Instructions (Addendum)
Rest and push fluids No work today We will call you with lab results Take Amoxicillin twice daily for 10 days Continue Xyzal 5 mg at bedtime Use Flonase nasal spray daily   Sinusitis, Adult Sinusitis is soreness and swelling (inflammation) of your sinuses. Sinuses are hollow spaces in the bones around your face. They are located: Around your eyes. In the middle of your forehead. Behind your nose. In your cheekbones. Your sinuses and nasal passages are lined with a fluid called mucus. Mucus drains out of your sinuses. Swelling can trap mucus in your sinuses. This lets germs (bacteria, virus, or fungus) grow, which leads to infection. Most of the time, this condition is caused bya virus. What are the causes? This condition is caused by: Allergies. Asthma. Germs. Things that block your nose or sinuses. Growths in the nose (nasal polyps). Chemicals or irritants in the air. Fungus (rare). What increases the risk? You are more likely to develop this condition if: You have a weak body defense system (immune system). You do a lot of swimming or diving. You use nasal sprays too much. You smoke. What are the signs or symptoms? The main symptoms of this condition are pain and a feeling of pressure around the sinuses. Other symptoms include: Stuffy nose (congestion). Runny nose (drainage). Swelling and warmth in the sinuses. Headache. Toothache. A cough that may get worse at night. Mucus that collects in the throat or the back of the nose (postnasal drip). Being unable to smell and taste. Being very tired (fatigue). A fever. Sore throat. Bad breath. How is this diagnosed? This condition is diagnosed based on: Your symptoms. Your medical history. A physical exam. Tests to find out if your condition is short-term (acute) or long-term (chronic). Your doctor may: Check your nose for growths (polyps). Check your sinuses using a tool that has a light (endoscope). Check for allergies  or germs. Do imaging tests, such as an MRI or CT scan. How is this treated? Treatment for this condition depends on the cause and whether it is short-term or long-term. If caused by a virus, your symptoms should go away on their own within 10 days. You may be given medicines to relieve symptoms. They include: Medicines that shrink swollen tissue in the nose. Medicines that treat allergies (antihistamines). A spray that treats swelling of the nostrils.  Rinses that help get rid of thick mucus in your nose (nasal saline washes). If caused by bacteria, your doctor may wait to see if you will get better without treatment. You may be given antibiotic medicine if you have: A very bad infection. A weak body defense system. If caused by growths in the nose, you may need to have surgery. Follow these instructions at home: Medicines Take, use, or apply over-the-counter and prescription medicines only as told by your doctor. These may include nasal sprays. If you were prescribed an antibiotic medicine, take it as told by your doctor. Do not stop taking the antibiotic even if you start to feel better. Hydrate and humidify  Drink enough water to keep your pee (urine) pale yellow. Use a cool mist humidifier to keep the humidity level in your home above 50%. Breathe in steam for 10-15 minutes, 3-4 times a day, or as told by your doctor. You can do this in the bathroom while a hot shower is running. Try not to spend time in cool or dry air.  Rest Rest as much as you can. Sleep with your head raised (elevated). Make sure  you get enough sleep each night. General instructions  Put a warm, moist washcloth on your face 3-4 times a day, or as often as told by your doctor. This will help with discomfort. Wash your hands often with soap and water. If there is no soap and water, use hand sanitizer. Do not smoke. Avoid being around people who are smoking (secondhand smoke). Keep all follow-up visits as told  by your doctor. This is important.  Contact a doctor if: You have a fever. Your symptoms get worse. Your symptoms do not get better within 10 days. Get help right away if: You have a very bad headache. You cannot stop throwing up (vomiting). You have very bad pain or swelling around your face or eyes. You have trouble seeing. You feel confused. Your neck is stiff. You have trouble breathing. Summary Sinusitis is swelling of your sinuses. Sinuses are hollow spaces in the bones around your face. This condition is caused by tissues in your nose that become inflamed or swollen. This traps germs. These can lead to infection. If you were prescribed an antibiotic medicine, take it as told by your doctor. Do not stop taking it even if you start to feel better. Keep all follow-up visits as told by your doctor. This is important. This information is not intended to replace advice given to you by your health care provider. Make sure you discuss any questions you have with your healthcare provider. Document Revised: 11/30/2017 Document Reviewed: 11/30/2017 Elsevier Patient Education  2022 [employer]  Rehydration, Adult Rehydration is the replacement of body fluids, salts, and minerals (electrolytes) that are lost during dehydration. Dehydration is when there is not enough water or other fluids in the body. This happens when you lose more fluids than you take in. Common causes of dehydration include: Not drinking enough fluids. This can occur when you are ill or doing activities that require a lot of energy, especially in hot weather. Conditions that cause loss of water or other fluids, such as diarrhea, vomiting, sweating, or urinating a lot. Other illnesses, such as fever or infection. Certain medicines, such as those that remove excess fluid from the body (diuretics). Symptoms of mild or moderate dehydration may include thirst, dry lips and mouth, and dizziness. Symptoms of severe dehydration  may include increasedheart rate, confusion, fainting, and not urinating. For severe dehydration, you may need to get fluids through an IV at the hospital. For mild or moderate dehydration, you can usually rehydrate at homeby drinking certain fluids as told by your health care provider. What are the risks? Generally, rehydration is safe. However, taking in too much fluid (overhydration) can be a problem. This is rare. Overhydration can cause an electrolyte imbalance, kidney failure, or a decrease in salt (sodium) levels in the body. Supplies needed You will need an oral rehydration solution (ORS) if your health care provider tells you to use one. This is a drink to treat dehydration. It can be found inpharmacies and retail stores. How to rehydrate Fluids Follow instructions from your health care provider for rehydration. The kind of fluid and the amount you should drink depend on your condition. In general, you should choose drinks that you prefer. If told by your health care provider, drink an ORS. Make an ORS by following instructions on the package. Start by drinking small amounts, about  cup (120 mL) every 5-10 minutes. Slowly increase how much you drink until you have taken the amount recommended by your health care provider. Drink enough  clear fluids to keep your urine pale yellow. If you were told to drink an ORS, finish it first, then start slowly drinking other clear fluids. Drink fluids such as: Water. This includes sparkling water and flavored water. Drinking only water can lead to having too little sodium in your body (hyponatremia). Follow the advice of your health care provider. Water from ice chips you suck on. Fruit juice with water you add to it (diluted). Sports drinks. Hot or cold herbal teas. Broth-based soups. Milk or milk products. Food Follow instructions from your health care provider about what to eat while you rehydrate. Your health care provider may recommend that you  slowly begin eating regular foods in small amounts. Eat foods that contain a healthy balance of electrolytes, such as bananas, oranges, potatoes, tomatoes, and spinach. Avoid foods that are greasy or contain a lot of sugar. In some cases, you may get nutrition through a feeding tube that is passed through your nose and into your stomach (nasogastric tube, or NG tube). This may be done if you have uncontrolled vomiting or diarrhea. Beverages to avoid  Certain beverages may make dehydration worse. While you rehydrate, avoiddrinking alcohol. How to tell if you are recovering from dehydration You may be recovering from dehydration if: You are urinating more often than before you started rehydrating. Your urine is pale yellow. Your energy level improves. You vomit less frequently. You have diarrhea less frequently. Your appetite improves or returns to normal. You feel less dizzy or less light-headed. Your skin tone and color start to look more normal. Follow these instructions at home: Take over-the-counter and prescription medicines only as told by your health care provider. Do not take sodium tablets. Doing this can lead to having too much sodium in your body (hypernatremia). Contact a health care provider if: You continue to have symptoms of mild or moderate dehydration, such as: Thirst. Dry lips. Slightly dry mouth. Dizziness. Dark urine or less urine than normal. Muscle cramps. You continue to vomit or have diarrhea. Get help right away if you: Have symptoms of dehydration that get worse. Have a fever. Have a severe headache. Have been vomiting and the following happens: Your vomiting gets worse or does not go away. Your vomit includes blood or green matter (bile). You cannot eat or drink without vomiting. Have problems with urination or bowel movements, such as: Diarrhea that gets worse or does not go away. Blood in your stool (feces). This may cause stool to look black and  tarry. Not urinating, or urinating only a small amount of very dark urine, within 6-8 hours. Have trouble breathing. Have symptoms that get worse with treatment. These symptoms may represent a serious problem that is an emergency. Do not wait to see if the symptoms will go away. Get medical help right away. Call your local emergency services (911 in the [place] Do not drive yourself to the hospital. Summary Rehydration is the replacement of body fluids and minerals (electrolytes) that are lost during dehydration. Follow instructions from your health care provider for rehydration. The kind of fluid and amount you should drink depend on your condition. Slowly increase how much you drink until you have taken the amount recommended by your health care provider. Contact your health care provider if you continue to show signs of mild or moderate dehydration. This information is not intended to replace advice given to you by your health care provider. Make sure you discuss any questions you have with your healthcare provider. Document Revised:  08/31/2019 Document Reviewed: 07/11/2019 Elsevier Patient Education  2022 [employer] Dehydration, Adult Dehydration is condition in which there is not enough water or other fluids in the body. This happens when a person loses more fluids than he or she takes in. Important body parts cannot work right without the right amount of fluids. Anyloss of fluids from the body can cause dehydration. Dehydration can be mild, worse, or very bad. It should be treated right away tokeep it from getting very bad. What are the causes? This condition may be caused by: Conditions that cause loss of water or other fluids, such as: Watery poop (diarrhea). Vomiting. Sweating a lot. Peeing (urinating) a lot. Not drinking enough fluids, especially when you: Are ill. Are doing things that take a lot of energy to do. Other illnesses and conditions, such as fever or  infection. Certain medicines, such as medicines that take extra fluid out of the body (diuretics). Lack of safe drinking water. Not being able to get enough water and food. What increases the risk? The following factors may make you more likely to develop this condition: Having a long-term (chronic) illness that has not been treated the right way, such as: Diabetes. Heart disease. Kidney disease. Being 25 years of age or older. Having a disability. Living in a place that is high above the ground or sea (high in altitude). The thinner, dried air causes more fluid loss. Doing exercises that put stress on your body for a long time. What are the signs or symptoms? Symptoms of dehydration depend on how bad it is. Mild or worse dehydration Thirst. Dry lips or dry mouth. Feeling dizzy or light-headed, especially when you stand up from sitting. Muscle cramps. Your body making: Dark pee (urine). Pee may be the color of tea. Less pee than normal. Less tears than normal. Headache. Very bad dehydration Changes in skin. Skin may: Be cold to the touch (clammy). Be blotchy or pale. Not go back to normal right after you lightly pinch it and let it go. Little or no tears, pee, or sweat. Changes in vital signs, such as: Fast breathing. Low blood pressure. Weak pulse. Pulse that is more than 100 beats a minute when you are sitting still. Other changes, such as: Feeling very thirsty. Eyes that look hollow (sunken). Cold hands and feet. Being mixed up (confused). Being very tired (lethargic) or having trouble waking from sleep. Short-term weight loss. Loss of consciousness. How is this treated? Treatment for this condition depends on how bad it is. Treatment should start right away. Do not wait until your condition gets very bad. Very bad dehydration is an emergency. You will need to go to a hospital. Mild or worse dehydration can be treated at home. You may be asked to: Drink more  fluids. Drink an oral rehydration solution (ORS). This drink helps get the right amounts of fluids and salts and minerals in the blood (electrolytes). Very bad dehydration can be treated: With fluids through an IV tube. By getting normal levels of salts and minerals in your blood. This is often done by giving salts and minerals through a tube. The tube is passed through your nose and into your stomach. By treating the root cause. Follow these instructions at home: Oral rehydration solution If told by your doctor, drink an ORS: Make an ORS. Use instructions on the package. Start by drinking small amounts, about  cup (120 mL) every 5-10 minutes. Slowly drink more until you have had the amount that your  doctor said to have. Eating and drinking        Drink enough clear fluid to keep your pee pale yellow. If you were told to drink an ORS, finish the ORS first. Then, start slowly drinking other clear fluids. Drink fluids such as: Water. Do not drink only water. Doing that can make the salt (sodium) level in your body get too low. Water from ice chips you suck on. Fruit juice that you have added water to (diluted). Low-calorie sports drinks. Eat foods that have the right amounts of salts and minerals, such as: Bananas. Oranges. Potatoes. Tomatoes. Spinach. Do not drink alcohol. Avoid: Drinks that have a lot of sugar. These include: High-calorie sports drinks. Fruit juice that you did not add water to. Soda. Caffeine. Foods that are greasy or have a lot of fat or sugar. General instructions Take over-the-counter and prescription medicines only as told by your doctor. Do not take salt tablets. Doing that can make the salt level in your body get too high. Return to your normal activities as told by your doctor. Ask your doctor what activities are safe for you. Keep all follow-up visits as told by your doctor. This is important. Contact a doctor if: You have pain in your belly  (abdomen) and the pain: Gets worse. Stays in one place. You have a rash. You have a stiff neck. You get angry or annoyed (irritable) more easily than normal. You are more tired or have a harder time waking than normal. You feel: Weak or dizzy. Very thirsty. Get help right away if you have: Any symptoms of very bad dehydration. Symptoms of vomiting, such as: You cannot eat or drink without vomiting. Your vomiting gets worse or does not go away. Your vomit has blood or green stuff in it. Symptoms that get worse with treatment. A fever. A very bad headache. Problems with peeing or pooping (having a bowel movement), such as: Watery poop that gets worse or does not go away. Blood in your poop (stool). This may cause poop to look black and tarry. Not peeing in 6-8 hours. Peeing only a small amount of very dark pee in 6-8 hours. Trouble breathing. These symptoms may be an emergency. Do not wait to see if the symptoms will go away. Get medical help right away. Call your local emergency services (911 in the [place] Do not drive yourself to the hospital. Summary Dehydration is a condition in which there is not enough water or other fluids in the body. This happens when a person loses more fluids than he or she takes in. Treatment for this condition depends on how bad it is. Treatment should be started right away. Do not wait until your condition gets very bad. Drink enough clear fluid to keep your pee pale yellow. If you were told to drink an oral rehydration solution (ORS), finish the ORS first. Then, start slowly drinking other clear fluids. Take over-the-counter and prescription medicines only as told by your doctor. Get help right away if you have any symptoms of very bad dehydration. This information is not intended to replace advice given to you by your health care provider. Make sure you discuss any questions you have with your healthcare provider. Document Revised: 02/10/2019 Document  Reviewed: 02/10/2019 [employer] Patient Education  2022 [employer]

## 2021-02-18 NOTE — Progress Notes (Signed)
Acute Office Visit  Subjective:    Patient ID: Brad Zuniga, male    DOB: [birth year], 50 y.o.   MRN: [id number]  Chief Complaint  Patient presents with   Dizziness   Headache    HPI: Brad Zuniga is a 50 year old Caucasian male that presents with headache and dizziness for 3-days. Treatment has included Tylenol 1,000 mg PRN and applying pressure to forehead. He tells me that he had positive COVID-19 test on 01/31/21. States he has not returned to baseline appetite or energy level. He has lost 10 pounds.  He works outdoors Secondary school teacher. He began feeling lightheaded and dizzy while at work 3-days ago in 95+ degree heat. States he tries to push fluids, Gatorade and water, as  much as possible. States he will eat breakfast in the morning and often skips lunch. He has a past medical history of GERD, chronic allergic rhinitis, and HTN. Treats GERD with Nexium and rhinitis with Xyzal daily. BP 108/82 today in office. Adriel tells me that his BP is normally 120s/70s.   Past Medical History:  Diagnosis Date   GERD (gastroesophageal reflux disease)     History reviewed. No pertinent surgical history.  Family History  Problem Relation Age of Onset   Heart attack Father    Diabetes Father    Hypertension Father    Deep vein thrombosis Father    Breast cancer Sister    Heart defect Sister    Thyroid disease Sister    Lung cancer Maternal Grandmother    Lung cancer Maternal Grandfather    Heart attack Paternal Grandmother    Lung cancer Paternal Grandfather     Social History   Socioeconomic History   Marital status: Married    Spouse name: Not on file   Number of children: 2   Years of education: Not on file   Highest education level: Associate degree: occupational, Hotel manager, or vocational program  Occupational History   Occupation: Automotive engineer: Orthoptist  Tobacco Use   Smoking status: Never   Smokeless tobacco: Current     Types: Snuff  Vaping Use   Vaping Use: Never used  Substance and Sexual Activity   Alcohol use: Not Currently   Drug use: Never   Sexual activity: Not on file  Other Topics Concern   Not on file  Social History Narrative   Patient is right-handed. He lives with his wife and one of his daughters in a 2 story house. He rarely drinks caffeine now, ghe is very active at work.   Social Determinants of Health   Financial Resource Strain: Not on file  Food Insecurity: Not on file  Transportation Needs: Not on file  Physical Activity: Not on file  Stress: Not on file  Social Connections: Not on file  Intimate Partner Violence: Not on file    Outpatient Medications Prior to Visit  Medication Sig Dispense Refill   esomeprazole (NEXIUM) 40 MG capsule Take 40 mg by mouth daily at 12 noon.     lisinopril (ZESTRIL) 5 MG tablet TAKE 1 TABLET BY MOUTH DAILY. 90 tablet 0   No facility-administered medications prior to visit.    No Known Allergies  Review of Systems  Constitutional:  Positive for appetite change (DECREASED) and unexpected weight change (LOST 10 POUNDS).  HENT:  Positive for congestion, rhinorrhea and sinus pressure. Negative for sore throat.   Eyes:  Positive for visual disturbance (BLURRY VISION).  Respiratory:  Positive for cough (  NON-PRODUCTIVE).   Cardiovascular: Negative.   Gastrointestinal:  Positive for nausea.       Gerd  Endocrine: Negative.   Genitourinary: Negative.   Musculoskeletal: Negative.   Allergic/Immunologic: Positive for environmental allergies.  Neurological:  Positive for dizziness, light-headedness and headaches.  Psychiatric/Behavioral: Negative.        Objective:    Physical Exam Vitals reviewed.  Constitutional:      Appearance: Normal appearance.  HENT:     Head: Normocephalic.     Right Ear: Tympanic membrane normal.     Left Ear: Tympanic membrane normal.     Nose: Nasal tenderness, congestion and rhinorrhea present.     Right  Turbinates: Swollen and pale.     Left Turbinates: Swollen and pale.     Right Sinus: Maxillary sinus tenderness present.     Left Sinus: Maxillary sinus tenderness present.     Mouth/Throat:     Mouth: Mucous membranes are moist.     Pharynx: Posterior oropharyngeal erythema present.  Eyes:     Pupils: Pupils are equal, round, and reactive to light.  Cardiovascular:     Rate and Rhythm: Normal rate and regular rhythm.     Pulses: Normal pulses.     Heart sounds: Normal heart sounds.  Pulmonary:     Effort: Pulmonary effort is normal.     Breath sounds: Normal breath sounds.  Abdominal:     General: Bowel sounds are normal.     Palpations: Abdomen is soft.  Musculoskeletal:        General: Normal range of motion.     Cervical back: Neck supple.  Skin:    General: Skin is warm and dry.     Capillary Refill: Capillary refill takes less than 2 seconds.  Neurological:     General: No focal deficit present.     Mental Status: He is alert and oriented to person, place, and time.  Psychiatric:        Mood and Affect: Mood normal.        Behavior: Behavior normal.   BP 108/82   Pulse 62   Resp 18   Ht _0  (1.88 m)   Wt 191 lb (86.6 kg)   SpO2 98%   BMI 24.52 kg/m    Wt Readings from Last 3 Encounters:  01/31/21 202 lb (91.6 kg)  11/13/20 202 lb (91.6 kg)  09/28/20 205 lb (93 kg)    Health Maintenance Due  Topic Date Due   COVID-19 Vaccine (1) Never done   HIV Screening  Never done   Hepatitis C Screening  Never done   TETANUS/TDAP  Never done   Zoster Vaccines- Shingrix (1 of 2) Never done       Lab Results  Component Value Date   TSH 2.010 09/28/2020   Lab Results  Component Value Date   WBC 4.0 09/28/2020   HGB 13.4 09/28/2020   HCT 41.3 09/28/2020   MCV 91 09/28/2020   PLT 225 09/28/2020   Lab Results  Component Value Date   NA 139 09/28/2020   K 4.5 09/28/2020   CO2 22 09/28/2020   GLUCOSE 107 (H) 09/28/2020   BUN 14 09/28/2020   CREATININE  1.01 09/28/2020   BILITOT 0.2 09/28/2020   ALKPHOS 64 09/28/2020   AST 18 09/28/2020   ALT 13 09/28/2020   PROT 6.7 09/28/2020   ALBUMIN 4.6 09/28/2020   CALCIUM 9.3 09/28/2020   ANIONGAP 7 12/20/2017   EGFR 91 09/28/2020  Lab Results  Component Value Date   CHOL 169 08/15/2020   Lab Results  Component Value Date   HDL 39 (L) 08/15/2020   Lab Results  Component Value Date   LDLCALC 116 (H) 08/15/2020   Lab Results  Component Value Date   TRIG 74 08/15/2020   Lab Results  Component Value Date   CHOLHDL 4.3 08/15/2020        Assessment & Plan:   1. Nonintractable episodic headache, unspecified headache type - CBC with Differential/Platelet - Comprehensive metabolic panel -Continue Tylenol as directed -Avoid heat and humidity today -Rest and push fluids  2. COVID-19 long hauler manifesting chronic fatigue - CBC with Differential/Platelet - Comprehensive metabolic panel  3. Other sinusitis, unspecified chronicity - amoxicillin (AMOXIL) 875 MG tablet; Take 1 tablet (875 mg total) by mouth 2 (two) times daily for 10 days.  Dispense: 20 tablet; Refill: 0 - fluticasone (FLONASE) 50 MCG/ACT nasal spray; Place 2 sprays into both nostrils daily.  Dispense: 16 g; Refill: 6  -continue Xyzal 5 mg daily  4. Unintentional weight loss - CBC with Differential/Platelet - Comprehensive metabolic panel  -small frequent meals -rest and push fluids    Rest and push fluids No work today We will call you with lab results Take Amoxicillin twice daily for 10 days Continue Xyzal 5 mg at bedtime Use Flonase nasal spray daily  Follow-up: PRN  I, Rip Harbour, NP, have reviewed all documentation for this visit. The documentation on 02/18/21 for the exam, diagnosis, procedures, and orders are all accurate and complete.     Signed, Rip Harbour, NP

## 2021-02-19 ENCOUNTER — Other Ambulatory Visit: Payer: Self-pay | Admitting: Nurse Practitioner

## 2021-02-19 DIAGNOSIS — R7301 Impaired fasting glucose: Secondary | ICD-10-CM

## 2021-02-19 DIAGNOSIS — R39198 Other difficulties with micturition: Secondary | ICD-10-CM

## 2021-02-21 LAB — HGB A1C W/O EAG: Hgb A1c MFr Bld: 5.9 % — ABNORMAL HIGH (ref 4.8–5.6)

## 2021-02-21 LAB — SPECIMEN STATUS REPORT

## 2021-02-21 LAB — PSA: Prostate Specific Ag, Serum: 0.9 ng/mL (ref 0.0–4.0)

## 2021-02-25 ENCOUNTER — Ambulatory Visit (INDEPENDENT_AMBULATORY_CARE_PROVIDER_SITE_OTHER): Payer: BC Managed Care – PPO | Admitting: Otolaryngology

## 2021-02-25 NOTE — Progress Notes (Signed)
Virtual Visit via Video Note The purpose of this virtual visit is to provide medical care while limiting exposure to the novel coronavirus.    Consent was obtained for video visit:  Yes.   Answered questions that patient had about telehealth interaction:  Yes.   I discussed the limitations, risks, security and privacy concerns of performing an evaluation and management service by telemedicine. I also discussed with the patient that there may be a patient responsible charge related to this service. The patient expressed understanding and agreed to proceed.  Pt location: Home Physician Location: office Name of referring provider:  Marianne Sofia, PA-C I connected with Brad Zuniga at patients initiation/request on 02/26/2021 at  8:50 AM EDT by video enabled telemedicine application and verified that I am speaking with the correct person using two identifiers. Pt MRN:  [id number] Pt DOB:  [birth year] Video Participants:  Brad Zuniga  Assessment and Plan:   Transient altered sensorium/dizziness - endorses two brief episodes of loss of awareness.  Neurologic workup unremarkable.  I do not suspect seizure.  I do not have a clear diagnosis but he has been doing well.  He may follow up as needed.  History of Present Illness:  Brad Zuniga is a 50 year old right-handed male who follows up for confusion.    UPDATE: Cardiac event monitor was unremarkable.  48 hour ambulatory EEG on 09/10/2020 was normal.   He says he has been feeling good.  No recurrent spells.  He thinks a lot of it was due to stress.     HISTORY: In early May 2019, he woke up one morning with vertigo, described as room spinning.  They spinning was positional, occurring when laying down or turning his head quickly.  It would last up to a couple of minutes.  He was fine if he stayed still.  He was diagnosed with BPPV.     On 12/19/17 he had an episode of transient confusion.  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  He didn't know which buttons to press or how to move the joystick.  This was concerning because he has used this machine for years.  He also did not feel well.  He felt shaky and had cold sweats.  He needed to call somebody else to finish the job.  He felt ill for about 2 hours before symptoms resolved.  He was not amnestic to the events of that morning.  He did not have any trouble recognizing familiar faces.  He did not have any associated headache, facial droop, slurred speech, visual disturbance, bowel or bladder incontinence or unilateral numbness or weakness.  At the time, he was not overworked or dehydrated.  He was not performing and strenuous activity.  He felt relaxed.  MRI of brain with and without contrast on 12/20/17 was normal.  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  MRA of neck was normal.  Routine awake and sleep EEG on 02/01/2018 showed shifting asymmetry of the bilateral temporal lobes during drowsiness but within normal limits.  His PCP reportedly checked Lyme and RMSF.  Lyme was negative.  He said that he tested positive for RMSF and was prescribed doxycycline.  He denies rash or fever.  On 08/02/2020, he was at work on a lift and looked up when he felt like he may have passed out for just a second.  He didn't fall.  On 1/23/202, he was sitting  in a recliner with his head cocked leaning to the right when it occurred again for a second .  He continues to have occasional dizzy spells where he feels brief lightheadedness, but no spinning or loss of consciousness.  He followed up with his PCP and blood pressure was elevated and advised to go to the hospital.  He was admitted to [facility].  He had a dizzy spell on telemetry without changes.  No change in blood pressure.  Echocardiogram was normal.  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  MRI of brain with  and without contrast was normal.  Routine EEG on 08/08/2020 again demonstrated transient slowing in the left temporal region but no epileptiform discharges or electrographic seizures.  Temporal lobe epilepsy was considered but he was not started on an antiepileptic medication.  He denies epigastric rising, phantosmia, convulsions.  He did endorse deja vu, but that was a one time situational event where he went somewhere and thought he may have been there before - it is not a recurrent episodic event or sensation  Past Medical History: Past Medical History:  Diagnosis Date   GERD (gastroesophageal reflux disease)     Medications: Outpatient Encounter Medications as of 02/26/2021  Medication Sig   amoxicillin (AMOXIL) 875 MG tablet Take 1 tablet (875 mg total) by mouth 2 (two) times daily for 10 days.   esomeprazole (NEXIUM) 40 MG capsule Take 40 mg by mouth daily at 12 noon.   fluticasone (FLONASE) 50 MCG/ACT nasal spray Place 2 sprays into both nostrils daily.   lisinopril (ZESTRIL) 5 MG tablet TAKE 1 TABLET BY MOUTH DAILY.   No facility-administered encounter medications on file as of 02/26/2021.    Allergies: No Known Allergies  Family History: Family History  Problem Relation Age of Onset   Heart attack Father    Diabetes Father    Hypertension Father    Deep vein thrombosis Father    Breast cancer Sister    Heart defect Sister    Thyroid disease Sister    Lung cancer Maternal Grandmother    Lung cancer Maternal Grandfather    Heart attack Paternal Grandmother    Lung cancer Paternal Grandfather     Observations/Objective:   There were no vitals taken for this visit. No acute distress.  Alert and oriented.  Speech fluent and not dysarthric.  Language intact.    Follow Up Instructions:    -I discussed the assessment and treatment plan with the patient. The patient was provided an opportunity to ask questions and all were answered. The patient agreed with the plan and  demonstrated an understanding of the instructions.   The patient was advised to call back or seek an in-person evaluation if the symptoms worsen or if the condition fails to improve as anticipated.   Cira Servant, DO

## 2021-02-26 ENCOUNTER — Encounter: Payer: Self-pay | Admitting: Neurology

## 2021-02-26 ENCOUNTER — Other Ambulatory Visit: Payer: Self-pay

## 2021-02-26 ENCOUNTER — Telehealth (INDEPENDENT_AMBULATORY_CARE_PROVIDER_SITE_OTHER): Payer: BC Managed Care – PPO | Admitting: Neurology

## 2021-02-26 DIAGNOSIS — R42 Dizziness and giddiness: Secondary | ICD-10-CM | POA: Diagnosis not present

## 2021-02-27 ENCOUNTER — Ambulatory Visit (INDEPENDENT_AMBULATORY_CARE_PROVIDER_SITE_OTHER): Payer: BC Managed Care – PPO | Admitting: Otolaryngology

## 2021-02-27 DIAGNOSIS — H9312 Tinnitus, left ear: Secondary | ICD-10-CM | POA: Diagnosis not present

## 2021-02-27 NOTE — Progress Notes (Signed)
HPI: Brad Zuniga is a 50 y.o. male who presents is referred by Dr. Servando Salina for evaluation of left ear complaints.  He complains of intermittent itching in the left ear that occurs more on the upper portion of the auricle.  He is not having much itching today this, comes and goes.  He has also had ringing more so in the left ear compared to the right.  He uses a fan at night and this seems to help with the ringing in the left ear.  He has occasional dizziness but does not really describe true vertigo.  He has not noted any significant hearing problems.  He has shotguns frequently when he was younger..  Past Medical History:  Diagnosis Date   GERD (gastroesophageal reflux disease)    No past surgical history on file. 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 He lives with his wife and one of his daughters in a 2 story house. He rarely drinks caffeine now, ghe is very active at work.   Social Determinants of Health   Financial Resource Strain: Not on file  Food Insecurity: Not on file  Transportation Needs: Not on file  Physical Activity: Not on file  Stress: Not on file  Social Connections: Not on file   Family History  Problem Relation Age of Onset   Heart attack Father    Diabetes Father    Hypertension Father    Deep vein thrombosis Father    Breast cancer Sister     Heart defect Sister    Thyroid disease Sister    Lung cancer Maternal Grandmother    Lung cancer Maternal Grandfather    Heart attack Paternal Grandmother    Lung cancer Paternal Grandfather    No Known Allergies Prior to Admission medications   Medication Sig Start Date End Date Taking? Authorizing Provider  amoxicillin (AMOXIL) 875 MG tablet Take 1 tablet (875 mg total) by mouth 2 (two) times daily for 10 days. 02/18/21 02/28/21  Janie Morning, NP  esomeprazole (NEXIUM) 40 MG capsule Take 40 mg by mouth daily at 12 noon.    [provider]  fluticasone (FLONASE) 50 MCG/ACT nasal spray Place 2 sprays into both nostrils daily. 02/18/21   Janie Morning, NP  lisinopril (ZESTRIL) 5 MG tablet TAKE 1 TABLET BY MOUTH DAILY. 12/24/20   Marianne Sofia, PA-C     Positive ROS: Otherwise negative  All other systems have been reviewed and were otherwise negative with the exception of those mentioned in the HPI and as above.  Physical Exam: Constitutional: Alert, well-appearing, no acute distress Ears: External ears without lesions or tenderness. Ear canals are clear bilaterally with intact, clear TMs bilaterally with good mobility on pneumatic otoscopy.  On hearing screening with a 512 1024 tuning fork he had  slight decrease hearing loss in the left ear compared to the right side with the 1024 tuning fork.  AC > BC bilaterally.  Dix-Hallpike testing revealed no clinical evidence of BPPV.  He has minimal crusting in the upper portion of the auricle where he has itching in the crevice superiorly around the antihelix. Nasal: External nose without lesions. Septum with mild deformity.. Clear nasal passages Oral: Lips and gums without lesions. Tongue and palate mucosa without lesions. Posterior oropharynx clear. Neck: No palpable adenopathy or masses Respiratory: Breathing comfortably  Skin: No facial/neck lesions or rash noted.  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 Itching in his ear is more of a skin skin problem of the auricle.  Plan: Concerning the itching in the ear recommend keeping the area clean with soap and water and application of hydrocortisone cream when he has any itching. Concerning the tinnitus discussed with him concerning using masking noise to help with the tinnitus is bad which he is already doing. Also discussed with him concerning possibly obtaining audiologic testing to evaluate degree of hearing loss.  But would recommend use of ear protection when around loud noise as this will make tinnitus worse.   Narda Bonds, MD   CC:

## 2021-03-01 ENCOUNTER — Telehealth: Payer: Self-pay

## 2021-03-01 NOTE — Telephone Encounter (Signed)
Pt calling asking about lisinopril. States he was told to not take lisinopril day after appointment on 02/18/21. He continued to not take medication, dizziness has stopped and states he feels great. He has been checking BP at home w/ automatic wrist BP cuff w/ BP 110s/70s. He is questioning what he should do with this medication. Did recommend he make a hypertension fu when he returns to town next week. He was also questioning if anything is needed for A1C currently. He has been for two weeks working on decreasing sugar in diet. Please advise.   Pt was informed provider out of office and will be back Monday. Pt VU.   Lorita Officer, CCMA 03/01/21 10:22 AM

## 2021-03-04 NOTE — Telephone Encounter (Signed)
Called pt. Pt answered, pt informed CMA he would most likely lose service. Pt did lose service. Attempted to call pt again 5 minutes later, still unreachable. Will attempt again later.   Lorita Officer, CCMA 03/04/21 8:05 AM

## 2021-03-04 NOTE — Telephone Encounter (Signed)
Pt returned call. Pt VU and made fu appointment for 9/6. Pt stated he will continue checking BP to be safe.   [name] 03/04/21 8:38 AM

## 2021-03-04 NOTE — Telephone Encounter (Signed)
Pt is prediabetic and this is not the cause of his feeling poorly.  Recommend continue to work on eating healthy and exercising.  Remain off lisinopril.  Make follow up with PCP in September.  KC

## 2021-03-12 ENCOUNTER — Other Ambulatory Visit: Payer: Self-pay | Admitting: Nurse Practitioner

## 2021-03-12 DIAGNOSIS — J329 Chronic sinusitis, unspecified: Secondary | ICD-10-CM

## 2021-03-19 ENCOUNTER — Encounter: Payer: Self-pay | Admitting: Physician Assistant

## 2021-03-19 ENCOUNTER — Other Ambulatory Visit: Payer: Self-pay

## 2021-03-19 ENCOUNTER — Ambulatory Visit (INDEPENDENT_AMBULATORY_CARE_PROVIDER_SITE_OTHER): Payer: BC Managed Care – PPO | Admitting: Physician Assistant

## 2021-03-19 VITALS — BP 128/80 | HR 91 | Temp 96.6°F | Ht 74.0 in | Wt 202.0 lb

## 2021-03-19 DIAGNOSIS — R899 Unspecified abnormal finding in specimens from other organs, systems and tissues: Secondary | ICD-10-CM | POA: Diagnosis not present

## 2021-03-19 DIAGNOSIS — I10 Essential (primary) hypertension: Secondary | ICD-10-CM

## 2021-03-19 NOTE — Progress Notes (Signed)
Subjective:  Patient ID: Brad Zuniga, male    DOB: Aug 22, 1970  Age: 50 y.o. MRN: [id number]  Chief Complaint  Patient presents with   Hypertension    HPI  Pt was on zestril 5mg  qd for hypertension but had been getting dizzy and stopped the medication - states overall he has been feeling better and no further dizziness - has been checking bp at home and it has been doing well Denies chest pain,sob,edema  Pt with history of low iron - has not been taking med at home - is due for labwork Current Outpatient Medications on File Prior to Visit  Medication Sig Dispense Refill   esomeprazole (NEXIUM) 40 MG capsule Take 40 mg by mouth daily at 12 noon.     fluticasone (FLONASE) 50 MCG/ACT nasal spray SPRAY 2 SPRAYS INTO EACH NOSTRIL EVERY DAY 48 mL 3   No current facility-administered medications on file prior to visit.   Past Medical History:  Diagnosis Date   GERD (gastroesophageal reflux disease)    History reviewed. No pertinent surgical history.  Family History  Problem Relation Age of Onset   Heart attack Father    Diabetes Father    Hypertension Father    Deep vein thrombosis Father    Breast cancer Sister    Heart defect Sister    Thyroid disease Sister    Lung cancer Maternal Grandmother    Lung cancer Maternal Grandfather    Heart attack Paternal Grandmother    Lung cancer Paternal Grandfather    Social History   Socioeconomic History   Marital status: Married    Spouse name: Not on file   Number of children: 2   Years of education: Not on file   Highest education level: Associate degree: occupational, , or vocational program  Occupational History   Occupation: Scientist, product/process development: Electrical engineer  Tobacco Use   Smoking status: Never   Smokeless tobacco: Current    Types: Snuff  Vaping Use   Vaping Use: Never used  Substance and Sexual Activity   Alcohol use: Not Currently   Drug use: Never   Sexual activity: Not on file   Other Topics Concern   Not on file  Social History Narrative   Patient is right-handed. He lives with his wife and one of his daughters in a 2 story house. He rarely drinks caffeine now, ghe is very active at work.   Social Determinants of Health   Financial Resource Strain: Not on file  Food Insecurity: Not on file  Transportation Needs: Not on file  Physical Activity: Not on file  Stress: Not on file  Social Connections: Not on file    Review of Systems CONSTITUTIONAL: Negative for chills, fatigue, fever, unintentional weight gain and unintentional weight loss.  CARDIOVASCULAR: Negative for chest pain, dizziness, palpitations and pedal edema.  RESPIRATORY: Negative for recent cough and dyspnea.  MSK: Negative for arthralgias and myalgias.  INTEGUMENTARY: Negative for rash.       Objective:  BP 128/80 (BP Location: Left Arm, Patient Position: Sitting, Cuff Size: Normal)   Pulse 91   Temp (!) 96.6 F (35.9 C) (Temporal)   Ht 6\' 2"  (1.88 m)   Wt 202 lb (91.6 kg)   SpO2 98%   BMI 25.94 kg/m   BP/Weight 03/19/2021 02/18/2021 01/31/2021  Systolic BP 128 108 -  Diastolic BP 80 82 -  Wt. (Lbs) 202 191 202  BMI 25.94 24.52 25.94    Physical  Exam PHYSICAL EXAM:   VS: BP 128/80 (BP Location: Left Arm, Patient Position: Sitting, Cuff Size: Normal)   Pulse 91   Temp (!) 96.6 F (35.9 C) (Temporal)   Ht 6\' 2"  (1.88 m)   Wt 202 lb (91.6 kg)   SpO2 98%   BMI 25.94 kg/m   GEN: Well nourished, well developed, in no acute distress  Cardiac: RRR; no murmurs, rubs, or gallops,no edema - Respiratory:  normal respiratory rate and pattern with no distress - normal breath sounds with no rales, rhonchi, wheezes or rubs Psych: euthymic mood, appropriate affect and demeanor  Diabetic Foot Exam - Simple   No data filed      Lab Results  Component Value Date   WBC 3.9 02/18/2021   HGB 13.5 02/18/2021   HCT 40.1 02/18/2021   PLT 250 02/18/2021   GLUCOSE 107 (H) 02/18/2021    CHOL 169 08/15/2020   TRIG 74 08/15/2020   HDL 39 (L) 08/15/2020   LDLCALC 116 (H) 08/15/2020   ALT 12 02/18/2021   AST 14 02/18/2021   NA 138 02/18/2021   K 4.4 02/18/2021   CL 101 02/18/2021   CREATININE 1.05 02/18/2021   BUN 11 02/18/2021   CO2 24 02/18/2021   TSH 2.010 09/28/2020   INR 1.03 12/20/2017   HGBA1C 5.9 (H) 02/18/2021      Assessment & Plan:   1. Benign hypertension Stay off meds at this time Continue to monitor bp 2. Abnormal laboratory test result - CBC with Differential/Platelet - Iron, TIBC and Ferritin Panel    No orders of the defined types were placed in this encounter.   Orders Placed This Encounter  Procedures   CBC with Differential/Platelet   Iron, TIBC and Ferritin Panel     Follow-up: Return in about 6 months (around 09/16/2021) for chronic follow up.  An After Visit Summary was printed and given to the patient.  11/16/2021 [facility] [phone]

## 2021-03-20 LAB — IRON,TIBC AND FERRITIN PANEL
Ferritin: 39 ng/mL (ref 30–400)
Iron Saturation: 12 % — ABNORMAL LOW (ref 15–55)
Iron: 42 ug/dL (ref 38–169)
Total Iron Binding Capacity: 341 ug/dL (ref 250–450)
UIBC: 299 ug/dL (ref 111–343)

## 2021-03-20 LAB — CBC WITH DIFFERENTIAL/PLATELET
Basophils Absolute: 0 10*3/uL (ref 0.0–0.2)
Basos: 1 %
EOS (ABSOLUTE): 0.2 10*3/uL (ref 0.0–0.4)
Eos: 5 %
Hematocrit: 43.1 % (ref 37.5–51.0)
Hemoglobin: 14.5 g/dL (ref 13.0–17.7)
Immature Grans (Abs): 0 10*3/uL (ref 0.0–0.1)
Immature Granulocytes: 1 %
Lymphocytes Absolute: 1.3 10*3/uL (ref 0.7–3.1)
Lymphs: 36 %
MCH: 30.1 pg (ref 26.6–33.0)
MCHC: 33.6 g/dL (ref 31.5–35.7)
MCV: 90 fL (ref 79–97)
Monocytes Absolute: 0.3 10*3/uL (ref 0.1–0.9)
Monocytes: 9 %
Neutrophils Absolute: 1.8 10*3/uL (ref 1.4–7.0)
Neutrophils: 48 %
Platelets: 227 10*3/uL (ref 150–450)
RBC: 4.81 x10E6/uL (ref 4.14–5.80)
RDW: 13.1 % (ref 11.6–15.4)
WBC: 3.7 10*3/uL (ref 3.4–10.8)

## 2021-03-22 ENCOUNTER — Other Ambulatory Visit: Payer: Self-pay | Admitting: Physician Assistant

## 2021-03-22 DIAGNOSIS — I10 Essential (primary) hypertension: Secondary | ICD-10-CM

## 2021-04-04 ENCOUNTER — Other Ambulatory Visit: Payer: Self-pay | Admitting: Emergency Medicine

## 2021-04-04 DIAGNOSIS — M545 Low back pain, unspecified: Secondary | ICD-10-CM

## 2021-04-04 DIAGNOSIS — S8991XA Unspecified injury of right lower leg, initial encounter: Secondary | ICD-10-CM

## 2021-04-06 ENCOUNTER — Other Ambulatory Visit: Payer: Self-pay

## 2021-04-06 ENCOUNTER — Ambulatory Visit
Admission: RE | Admit: 2021-04-06 | Discharge: 2021-04-06 | Disposition: A | Discharge status: Home / Self Care | Payer: BC Managed Care – PPO | Source: Ambulatory Visit | Attending: Emergency Medicine | Admitting: Emergency Medicine

## 2021-04-06 DIAGNOSIS — S8991XA Unspecified injury of right lower leg, initial encounter: Secondary | ICD-10-CM

## 2021-04-06 DIAGNOSIS — M545 Low back pain, unspecified: Secondary | ICD-10-CM

## 2021-04-06 DIAGNOSIS — M25561 Pain in right knee: Secondary | ICD-10-CM | POA: Diagnosis not present

## 2021-04-06 DIAGNOSIS — M48061 Spinal stenosis, lumbar region without neurogenic claudication: Secondary | ICD-10-CM | POA: Diagnosis not present

## 2021-04-15 ENCOUNTER — Encounter: Payer: Self-pay | Admitting: Physician Assistant

## 2021-04-15 ENCOUNTER — Other Ambulatory Visit: Payer: Self-pay

## 2021-04-15 ENCOUNTER — Ambulatory Visit (INDEPENDENT_AMBULATORY_CARE_PROVIDER_SITE_OTHER): Payer: BC Managed Care – PPO | Admitting: Physician Assistant

## 2021-04-15 VITALS — BP 128/78 | HR 83 | Temp 97.3°F | Ht 74.0 in | Wt 192.2 lb

## 2021-04-15 DIAGNOSIS — I889 Nonspecific lymphadenitis, unspecified: Secondary | ICD-10-CM | POA: Diagnosis not present

## 2021-04-15 DIAGNOSIS — J060 Acute laryngopharyngitis: Secondary | ICD-10-CM | POA: Diagnosis not present

## 2021-04-15 MED ORDER — CLARITHROMYCIN 500 MG PO TABS: 500.0000 mg | ORAL_TABLET | Freq: Two times a day (BID) | ORAL | 0 refills | Status: DC

## 2021-04-15 NOTE — Progress Notes (Signed)
Acute Office Visit  Subjective:    Patient ID: Brad Zuniga, male    DOB: 1971-06-13, 50 y.o.   MRN: [id number]  Chief Complaint  Patient presents with   URI    HPI Patient is in today for complaints of sinus pressure, ear pain and swollen lymph node in neck - he has been taking mucinex which has helped his symptoms and the lymph node is getting smaller but still with facial pressure pnd - denies fever   Past Medical History:  Diagnosis Date   GERD (gastroesophageal reflux disease)     History reviewed. No pertinent surgical history.  Family History  Problem Relation Age of Onset   Heart attack Father    Diabetes Father    Hypertension Father    Deep vein thrombosis Father    Breast cancer Sister    Heart defect Sister    Thyroid disease Sister    Lung cancer Maternal Grandmother    Lung cancer Maternal Grandfather    Heart attack Paternal Grandmother    Lung cancer Paternal Grandfather     Social History   Socioeconomic History   Marital status: Married    Spouse name: Not on file   Number of children: 2   Years of education: Not on file   Highest education level: Associate degree: occupational, Hotel manager, or vocational program  Occupational History   Occupation: Automotive engineer: Orthoptist  Tobacco Use   Smoking status: Never   Smokeless tobacco: Current    Types: Snuff  Vaping Use   Vaping Use: Never used  Substance and Sexual Activity   Alcohol use: Not Currently   Drug use: Never   Sexual activity: Not on file  Other Topics Concern   Not on file  Social History Narrative   Patient is right-handed. He lives with his wife and one of his daughters in a 2 story house. He rarely drinks caffeine now, ghe is very active at work.   Social Determinants of Health   Financial Resource Strain: Not on file  Food Insecurity: Not on file  Transportation Needs: Not on file  Physical Activity: Not on file  Stress: Not on file   Social Connections: Not on file  Intimate Partner Violence: Not on file    Outpatient Medications Prior to Visit  Medication Sig Dispense Refill   esomeprazole (NEXIUM) 40 MG capsule Take 40 mg by mouth daily at 12 noon.     fluticasone (FLONASE) 50 MCG/ACT nasal spray SPRAY 2 SPRAYS INTO EACH NOSTRIL EVERY DAY 48 mL 3   No facility-administered medications prior to visit.    No Known Allergies  Review of Systems CONSTITUTIONAL: Negative for chills, fatigue, fever, unintentional weight gain and unintentional weight loss.  E/N/T: see HPI CARDIOVASCULAR: Negative for chest pain, dizziness, palpitations and pedal edema.  RESPIRATORY: Negative for recent cough and dyspnea.          Objective:    Physical Exam PHYSICAL EXAM:   VS: BP 128/78 (BP Location: Left Arm, Patient Position: Sitting, Cuff Size: Normal)   Pulse 83   Temp (!) 97.3 F (36.3 C) (Temporal)   Ht _0  (1.88 m)   Wt 192 lb 3.2 oz (87.2 kg)   SpO2 96%   BMI 24.68 kg/m   GEN: Well nourished, well developed, in no acute distress  HEENT: right TM with air fluid level noted - left TM normal Oropharynx - mild erythema Neck: small right anterior lymph node  enlargement noted Cardiac: RRR; no murmurs, rubs, or gallops,no edema - Respiratory:  normal respiratory rate and pattern with no distress - normal breath sounds with no rales, rhonchi, wheezes or rubs   BP 128/78 (BP Location: Left Arm, Patient Position: Sitting, Cuff Size: Normal)   Pulse 83   Temp (!) 97.3 F (36.3 C) (Temporal)   Ht _0  (1.88 m)   Wt 192 lb 3.2 oz (87.2 kg)   SpO2 96%   BMI 24.68 kg/m  Wt Readings from Last 3 Encounters:  04/15/21 192 lb 3.2 oz (87.2 kg)  03/19/21 202 lb (91.6 kg)  02/18/21 191 lb (86.6 kg)    There are no preventive care reminders to display for this patient.  There are no preventive care reminders to display for this patient.   Lab Results  Component Value Date   TSH 2.010 09/28/2020   Lab Results   Component Value Date   WBC 3.7 03/19/2021   HGB 14.5 03/19/2021   HCT 43.1 03/19/2021   MCV 90 03/19/2021   PLT 227 03/19/2021   Lab Results  Component Value Date   NA 138 02/18/2021   K 4.4 02/18/2021   CO2 24 02/18/2021   GLUCOSE 107 (H) 02/18/2021   BUN 11 02/18/2021   CREATININE 1.05 02/18/2021   BILITOT 0.7 02/18/2021   ALKPHOS 62 02/18/2021   AST 14 02/18/2021   ALT 12 02/18/2021   PROT 6.8 02/18/2021   ALBUMIN 4.3 02/18/2021   CALCIUM 9.1 02/18/2021   ANIONGAP 7 12/20/2017   EGFR 86 02/18/2021   Lab Results  Component Value Date   CHOL 169 08/15/2020   Lab Results  Component Value Date   HDL 39 (L) 08/15/2020   Lab Results  Component Value Date   LDLCALC 116 (H) 08/15/2020   Lab Results  Component Value Date   TRIG 74 08/15/2020   Lab Results  Component Value Date   CHOLHDL 4.3 08/15/2020   Lab Results  Component Value Date   HGBA1C 5.9 (H) 02/18/2021       Assessment & Plan:   Problem List Items Addressed This Visit       Respiratory   Acute laryngopharyngitis - Primary   Relevant Medications   clarithromycin (BIAXIN) 500 MG tablet     Immune and Lymphatic   Lymphadenitis Antibiotics as above Follow up if symptoms persist/worsen   Meds ordered this encounter  Medications   clarithromycin (BIAXIN) 500 MG tablet    Sig: Take 1 tablet (500 mg total) by mouth 2 (two) times daily.    Dispense:  20 tablet    Refill:  0    Order Specific Question:   Supervising Provider    Answer:   Shelton Silvas    No orders of the defined types were placed in this encounter.    Follow-up: Return if symptoms worsen or fail to improve.  An After Visit Summary was printed and given to the patient.  Yetta Flock [facility] [phone]

## 2021-06-27 ENCOUNTER — Other Ambulatory Visit: Payer: Self-pay | Admitting: Physician Assistant

## 2021-06-27 DIAGNOSIS — M25561 Pain in right knee: Secondary | ICD-10-CM

## 2021-07-17 ENCOUNTER — Ambulatory Visit
Admission: RE | Admit: 2021-07-17 | Discharge: 2021-07-17 | Disposition: A | Discharge status: Home / Self Care | Payer: Worker's Compensation | Source: Ambulatory Visit | Attending: Physician Assistant | Admitting: Physician Assistant

## 2021-07-17 ENCOUNTER — Other Ambulatory Visit: Payer: Self-pay

## 2021-07-17 DIAGNOSIS — M25561 Pain in right knee: Secondary | ICD-10-CM

## 2021-07-31 ENCOUNTER — Encounter: Payer: Self-pay | Admitting: Physician Assistant

## 2021-07-31 ENCOUNTER — Ambulatory Visit (INDEPENDENT_AMBULATORY_CARE_PROVIDER_SITE_OTHER): Payer: BC Managed Care – PPO | Admitting: Physician Assistant

## 2021-07-31 VITALS — BP 120/84 | HR 86 | Temp 98.1°F | Ht 74.0 in | Wt 202.8 lb

## 2021-07-31 DIAGNOSIS — R0602 Shortness of breath: Secondary | ICD-10-CM

## 2021-07-31 DIAGNOSIS — I10 Essential (primary) hypertension: Secondary | ICD-10-CM | POA: Diagnosis not present

## 2021-07-31 DIAGNOSIS — J060 Acute laryngopharyngitis: Secondary | ICD-10-CM | POA: Diagnosis not present

## 2021-07-31 DIAGNOSIS — R0789 Other chest pain: Secondary | ICD-10-CM

## 2021-07-31 LAB — POCT INFLUENZA A/B
Influenza A, POC: NEGATIVE
Influenza B, POC: NEGATIVE

## 2021-07-31 LAB — POCT RESPIRATORY SYNCYTIAL VIRUS: RSV Rapid Ag: NEGATIVE

## 2021-07-31 LAB — POC COVID19 BINAXNOW: SARS Coronavirus 2 Ag: NEGATIVE

## 2021-07-31 MED ORDER — AZITHROMYCIN 250 MG PO TABS: ORAL_TABLET | ORAL | 0 refills | Status: AC

## 2021-07-31 NOTE — Progress Notes (Signed)
Acute Office Visit  Subjective:    Patient ID: Brad Zuniga, male    DOB: 02-03-1971, 51 y.o.   MRN: [id number]  Chief Complaint  Patient presents with   URI    HPI: Patient is in today for complaints of runny nose, headache, pnd, and mild cough - he states at times he feels like he cannot take a good deep breath - he has not had any chest pain or pressure but has had some more reflux and burping symptoms He has been exposed to RSV recently --- denies dyspnea or wheezing Is clearing throat a lot and now drainage is colored  Past Medical History:  Diagnosis Date   GERD (gastroesophageal reflux disease)     History reviewed. No pertinent surgical history.  Family History  Problem Relation Age of Onset   Heart attack Father    Diabetes Father    Hypertension Father    Deep vein thrombosis Father    Breast cancer Sister    Heart defect Sister    Thyroid disease Sister    Lung cancer Maternal Grandmother    Lung cancer Maternal Grandfather    Heart attack Paternal Grandmother    Lung cancer Paternal Grandfather     Social History   Socioeconomic History   Marital status: Married    Spouse name: Not on file   Number of children: 2   Years of education: Not on file   Highest education level: Associate degree: occupational, Hotel manager, or vocational program  Occupational History   Occupation: Automotive engineer: Orthoptist  Tobacco Use   Smoking status: Never   Smokeless tobacco: Current    Types: Snuff  Vaping Use   Vaping Use: Never used  Substance and Sexual Activity   Alcohol use: Not Currently   Drug use: Never   Sexual activity: Not on file  Other Topics Concern   Not on file  Social History Narrative   Patient is right-handed. He lives with his wife and one of his daughters in a 2 story house. He rarely drinks caffeine now, ghe is very active at work.   Social Determinants of Health   Financial Resource Strain: Not on file   Food Insecurity: Not on file  Transportation Needs: Not on file  Physical Activity: Not on file  Stress: Not on file  Social Connections: Not on file  Intimate Partner Violence: Not on file    Outpatient Medications Prior to Visit  Medication Sig Dispense Refill   esomeprazole (NEXIUM) 40 MG capsule Take 40 mg by mouth daily at 12 noon.     fluticasone (FLONASE) 50 MCG/ACT nasal spray SPRAY 2 SPRAYS INTO EACH NOSTRIL EVERY DAY 48 mL 3   clarithromycin (BIAXIN) 500 MG tablet Take 1 tablet (500 mg total) by mouth 2 (two) times daily. 20 tablet 0   No facility-administered medications prior to visit.    No Known Allergies  Review of Systems CONSTITUTIONAL: Negative for chills, fatigue, fever, unintentional weight gain and unintentional weight loss.  E/N/T: see HPI CARDIOVASCULAR: see HPI RESPIRATORY: see HPI GASTROINTESTINAL: Negative for abdominal pain, acid reflux symptoms, constipation, diarrhea, nausea and vomiting.  MSK: Negative for arthralgias and myalgias.  INTEGUMENTARY: Negative for rash.  NEUROLOGICAL: Negative for dizziness and headaches.  PSYCHIATRIC: Negative for sleep disturbance and to question depression screen.  Negative for depression, negative for anhedonia.         Objective:    Physical Exam PHYSICAL EXAM:   VS:  BP 120/84    Pulse 86    Temp 98.1 F (36.7 C)    Ht 6' 2"  (1.88 m)    Wt 202 lb 12.8 oz (92 kg)    SpO2 98%    BMI 26.04 kg/m   GEN: Well nourished, well developed, in no acute distress  HEENT: normal external ears and nose - normal external auditory canals and TMS -  - Lips, Teeth and Gums - normal  Oropharynx - moderate erythema noted Cardiac: RRR; no murmurs,  Respiratory:  faint rhonchi noted - clears with cough Skin: warm and dry, no rash  Psych: euthymic mood, appropriate affect and demeanor   Office Visit on 07/31/2021  Component Date Value Ref Range Status   SARS Coronavirus 2 Ag 07/31/2021 Negative  Negative Final   Influenza  A, POC 07/31/2021 Negative  Negative Final   Influenza B, POC 07/31/2021 Negative  Negative Final   RSV Rapid Ag 07/31/2021 Negative   Final    BP 120/84    Pulse 86    Temp 98.1 F (36.7 C)    Ht 6' 2"  (1.88 m)    Wt 202 lb 12.8 oz (92 kg)    SpO2 98%    BMI 26.04 kg/m  Wt Readings from Last 3 Encounters:  07/31/21 202 lb 12.8 oz (92 kg)  04/15/21 192 lb 3.2 oz (87.2 kg)  03/19/21 202 lb (91.6 kg)    There are no preventive care reminders to display for this patient.  There are no preventive care reminders to display for this patient.   Lab Results  Component Value Date   TSH 2.010 09/28/2020   Lab Results  Component Value Date   WBC 3.7 03/19/2021   HGB 14.5 03/19/2021   HCT 43.1 03/19/2021   MCV 90 03/19/2021   PLT 227 03/19/2021   Lab Results  Component Value Date   NA 138 02/18/2021   K 4.4 02/18/2021   CO2 24 02/18/2021   GLUCOSE 107 (H) 02/18/2021   BUN 11 02/18/2021   CREATININE 1.05 02/18/2021   BILITOT 0.7 02/18/2021   ALKPHOS 62 02/18/2021   AST 14 02/18/2021   ALT 12 02/18/2021   PROT 6.8 02/18/2021   ALBUMIN 4.3 02/18/2021   CALCIUM 9.1 02/18/2021   ANIONGAP 7 12/20/2017   EGFR 86 02/18/2021   Lab Results  Component Value Date   CHOL 169 08/15/2020   Lab Results  Component Value Date   HDL 39 (L) 08/15/2020   Lab Results  Component Value Date   LDLCALC 116 (H) 08/15/2020   Lab Results  Component Value Date   TRIG 74 08/15/2020   Lab Results  Component Value Date   CHOLHDL 4.3 08/15/2020   Lab Results  Component Value Date   HGBA1C 5.9 (H) 02/18/2021       Assessment & Plan:   Problem List Items Addressed This Visit       Respiratory   Acute laryngopharyngitis   Relevant Medications   azithromycin (ZITHROMAX) 250 MG tablet     Other   Dyspnea - Primary   Relevant Orders   POC COVID-19 BinaxNow (Completed)   POCT Influenza A/B (Completed)   POCT respiratory syncytial virus (Completed)   Chest pain   Relevant Orders    EKG 12-Lead   CBC with Differential/Platelet   Comprehensive metabolic panel   Troponin T   Other Visit Diagnoses        Meds ordered this encounter  Medications   azithromycin (  ZITHROMAX) 250 MG tablet    Sig: Take 2 tablets on day 1, then 1 tablet daily on days 2 through 5    Dispense:  6 tablet    Refill:  0    Order Specific Question:   Supervising Provider    AnswerShelton Silvas    Orders Placed This Encounter  Procedures   CBC with Differential/Platelet   Comprehensive metabolic panel   Troponin T   POC COVID-19 BinaxNow   POCT Influenza A/B   POCT respiratory syncytial virus   EKG 12-Lead     Follow-up: Return in about 2 weeks (around 08/14/2021) for bp check nurse visit.  An After Visit Summary was printed and given to the patient.  Yetta Flock [facility] [phone]

## 2021-08-01 LAB — COMPREHENSIVE METABOLIC PANEL
ALT: 14 IU/L (ref 0–44)
AST: 17 IU/L (ref 0–40)
Albumin/Globulin Ratio: 1.9 (ref 1.2–2.2)
Albumin: 4.8 g/dL (ref 3.8–4.9)
Alkaline Phosphatase: 71 IU/L (ref 44–121)
BUN/Creatinine Ratio: 10 (ref 9–20)
BUN: 10 mg/dL (ref 6–24)
Bilirubin Total: 0.3 mg/dL (ref 0.0–1.2)
CO2: 23 mmol/L (ref 20–29)
Calcium: 10.1 mg/dL (ref 8.7–10.2)
Chloride: 104 mmol/L (ref 96–106)
Creatinine, Ser: 0.98 mg/dL (ref 0.76–1.27)
Globulin, Total: 2.5 g/dL (ref 1.5–4.5)
Glucose: 112 mg/dL — ABNORMAL HIGH (ref 70–99)
Potassium: 4.5 mmol/L (ref 3.5–5.2)
Sodium: 143 mmol/L (ref 134–144)
Total Protein: 7.3 g/dL (ref 6.0–8.5)
eGFR: 93 mL/min/{1.73_m2} (ref >59)

## 2021-08-01 LAB — CBC WITH DIFFERENTIAL/PLATELET
Basophils Absolute: 0 10*3/uL (ref 0.0–0.2)
Basos: 0 %
EOS (ABSOLUTE): 0 10*3/uL (ref 0.0–0.4)
Eos: 1 %
Hematocrit: 44.1 % (ref 37.5–51.0)
Hemoglobin: 15.1 g/dL (ref 13.0–17.7)
Immature Grans (Abs): 0 10*3/uL (ref 0.0–0.1)
Immature Granulocytes: 0 %
Lymphocytes Absolute: 1.3 10*3/uL (ref 0.7–3.1)
Lymphs: 19 %
MCH: 30 pg (ref 26.6–33.0)
MCHC: 34.2 g/dL (ref 31.5–35.7)
MCV: 88 fL (ref 79–97)
Monocytes Absolute: 0.3 10*3/uL (ref 0.1–0.9)
Monocytes: 4 %
Neutrophils Absolute: 5.1 10*3/uL (ref 1.4–7.0)
Neutrophils: 76 %
Platelets: 285 10*3/uL (ref 150–450)
RBC: 5.03 x10E6/uL (ref 4.14–5.80)
RDW: 12.8 % (ref 11.6–15.4)
WBC: 6.7 10*3/uL (ref 3.4–10.8)

## 2021-08-01 LAB — TROPONIN T: Troponin T (Highly Sensitive): <6 ng/L (ref 0–22)

## 2021-08-14 ENCOUNTER — Ambulatory Visit: Payer: BC Managed Care – PPO

## 2021-09-06 ENCOUNTER — Other Ambulatory Visit: Payer: Self-pay

## 2021-09-06 ENCOUNTER — Encounter: Payer: Self-pay | Admitting: Physician Assistant

## 2021-09-06 ENCOUNTER — Ambulatory Visit (INDEPENDENT_AMBULATORY_CARE_PROVIDER_SITE_OTHER): Payer: BC Managed Care – PPO | Admitting: Physician Assistant

## 2021-09-06 VITALS — BP 130/80 | HR 79 | Temp 96.8°F | Ht 74.0 in | Wt 201.0 lb

## 2021-09-06 DIAGNOSIS — N341 Nonspecific urethritis: Secondary | ICD-10-CM

## 2021-09-06 DIAGNOSIS — R300 Dysuria: Secondary | ICD-10-CM

## 2021-09-06 LAB — POCT URINALYSIS DIP (CLINITEK)
Bilirubin, UA: NEGATIVE
Blood, UA: NEGATIVE
Glucose, UA: NEGATIVE mg/dL
Ketones, POC UA: NEGATIVE mg/dL
Leukocytes, UA: NEGATIVE
Nitrite, UA: NEGATIVE
POC PROTEIN,UA: NEGATIVE
Spec Grav, UA: 1.01 (ref 1.010–1.025)
Urobilinogen, UA: 0.2 E.U./dL
pH, UA: 5.5 (ref 5.0–8.0)

## 2021-09-06 MED ORDER — CIPROFLOXACIN HCL 500 MG PO TABS: 500.0000 mg | ORAL_TABLET | Freq: Two times a day (BID) | ORAL | 0 refills | Status: AC

## 2021-09-06 NOTE — Progress Notes (Signed)
Acute Office Visit  Subjective:    Patient ID: Brad Zuniga, male    DOB: [birth year], 51 y.o.   MRN: [id number]  Chief Complaint  Patient presents with   burning with urination    HPI: Patient is in today for complaints of dysuria since yesterday.  Denies fever, abdominal pain, nausea.  Denies hematuria.   He has been drinking excessive amounts of soda recently  Past Medical History:  Diagnosis Date   GERD (gastroesophageal reflux disease)     History reviewed. No pertinent surgical history.  Family History  Problem Relation Age of Onset   Heart attack Father    Diabetes Father    Hypertension Father    Deep vein thrombosis Father    Breast cancer Sister    Heart defect Sister    Thyroid disease Sister    Lung cancer Maternal Grandmother    Lung cancer Maternal Grandfather    Heart attack Paternal Grandmother    Lung cancer Paternal Grandfather     Social History   Socioeconomic History   Marital status: Married    Spouse name: Not on file   Number of children: 2   Years of education: Not on file   Highest education level: Associate degree: occupational, Hotel manager, or vocational program  Occupational History   Occupation: Automotive engineer: Orthoptist  Tobacco Use   Smoking status: Never   Smokeless tobacco: Current    Types: Snuff  Vaping Use   Vaping Use: Never used  Substance and Sexual Activity   Alcohol use: Not Currently   Drug use: Never   Sexual activity: Not on file  Other Topics Concern   Not on file  Social History Narrative   Patient is right-handed. He lives with his wife and one of his daughters in a 2 story house. He rarely drinks caffeine now, ghe is very active at work.   Social Determinants of Health   Financial Resource Strain: Not on file  Food Insecurity: Not on file  Transportation Needs: Not on file  Physical Activity: Not on file  Stress: Not on file  Social Connections: Not on file  Intimate  Partner Violence: Not on file    Outpatient Medications Prior to Visit  Medication Sig Dispense Refill   esomeprazole (NEXIUM) 40 MG capsule Take 40 mg by mouth daily at 12 noon.     fluticasone (FLONASE) 50 MCG/ACT nasal spray SPRAY 2 SPRAYS INTO EACH NOSTRIL EVERY DAY 48 mL 3   No facility-administered medications prior to visit.    No Known Allergies  Review of Systems CONSTITUTIONAL: Negative for chills, fatigue, fever, unintentional weight gain and unintentional weight loss.  CARDIOVASCULAR: Negative for chest pain, dizziness, palpitations and pedal edema.  RESPIRATORY: Negative for recent cough and dyspnea.  GASTROINTESTINAL: Negative for abdominal pain, acid reflux symptoms, constipation, diarrhea, nausea and vomiting.  GU - see HPI        Objective:    Physical Exam PHYSICAL EXAM:   VS: BP 130/80 (BP Location: Left Arm, Patient Position: Sitting, Cuff Size: Large)    Pulse 79    Temp (!) 96.8 F (36 C) (Temporal)    Ht 6' 2"  (1.88 m)    Wt 201 lb (91.2 kg)    SpO2 97%    BMI 25.81 kg/m   GEN: Well nourished, well developed, in no acute distress  Cardiac: RRR; no murmurs, rubs, or gallops,no edema -  Respiratory:  normal respiratory rate and pattern  with no distress - normal breath sounds with no rales, rhonchi, wheezes or rubs  Skin: warm and dry, no rash   Office Visit on 09/06/2021  Component Date Value Ref Range Status   Color, UA 09/06/2021 yellow  yellow Final   Clarity, UA 09/06/2021 clear  clear Final   Glucose, UA 09/06/2021 negative  negative mg/dL Final   Bilirubin, UA 09/06/2021 negative  negative Final   Ketones, POC UA 09/06/2021 negative  negative mg/dL Final   Spec Grav, UA 09/06/2021 1.010  1.010 - 1.025 Final   Blood, UA 09/06/2021 negative  negative Final   pH, UA 09/06/2021 5.5  5.0 - 8.0 Final   POC PROTEIN,UA 09/06/2021 negative  negative, trace Final   Urobilinogen, UA 09/06/2021 0.2  0.2 or 1.0 E.U./dL Final   Nitrite, UA 09/06/2021  Negative  Negative Final   Leukocytes, UA 09/06/2021 Negative  Negative Final     BP 130/80 (BP Location: Left Arm, Patient Position: Sitting, Cuff Size: Large)    Pulse 79    Temp (!) 96.8 F (36 C) (Temporal)    Ht 6' 2"  (1.88 m)    Wt 201 lb (91.2 kg)    SpO2 97%    BMI 25.81 kg/m  Wt Readings from Last 3 Encounters:  09/06/21 201 lb (91.2 kg)  07/31/21 202 lb 12.8 oz (92 kg)  04/15/21 192 lb 3.2 oz (87.2 kg)    There are no preventive care reminders to display for this patient.  There are no preventive care reminders to display for this patient.   Lab Results  Component Value Date   TSH 2.010 09/28/2020   Lab Results  Component Value Date   WBC 6.7 07/31/2021   HGB 15.1 07/31/2021   HCT 44.1 07/31/2021   MCV 88 07/31/2021   PLT 285 07/31/2021   Lab Results  Component Value Date   NA 143 07/31/2021   K 4.5 07/31/2021   CO2 23 07/31/2021   GLUCOSE 112 (H) 07/31/2021   BUN 10 07/31/2021   CREATININE 0.98 07/31/2021   BILITOT 0.3 07/31/2021   ALKPHOS 71 07/31/2021   AST 17 07/31/2021   ALT 14 07/31/2021   PROT 7.3 07/31/2021   ALBUMIN 4.8 07/31/2021   CALCIUM 10.1 07/31/2021   ANIONGAP 7 12/20/2017   EGFR 93 07/31/2021   Lab Results  Component Value Date   CHOL 169 08/15/2020   Lab Results  Component Value Date   HDL 39 (L) 08/15/2020   Lab Results  Component Value Date   LDLCALC 116 (H) 08/15/2020   Lab Results  Component Value Date   TRIG 74 08/15/2020   Lab Results  Component Value Date   CHOLHDL 4.3 08/15/2020   Lab Results  Component Value Date   HGBA1C 5.9 (H) 02/18/2021       Assessment & Plan:   Problem List Items Addressed This Visit   None Visit Diagnoses     Burning with urination    -  Primary   Relevant Orders   POCT URINALYSIS DIP (CLINITEK) (Completed)   Urethritis, nonspecific       Relevant Medications   ciprofloxacin (CIPRO) 500 MG tablet      Meds ordered this encounter  Medications   ciprofloxacin (CIPRO)  500 MG tablet    Sig: Take 1 tablet (500 mg total) by mouth 2 (two) times daily for 10 days.    Dispense:  20 tablet    Refill:  0    Order Specific  Question:   Supervising Provider    AnswerShelton Silvas    Orders Placed This Encounter  Procedures   POCT URINALYSIS DIP (CLINITEK)     Follow-up: Return if symptoms worsen or fail to improve.  An After Visit Summary was printed and given to the patient.  Yetta Flock [facility] [phone]

## 2021-09-24 ENCOUNTER — Ambulatory Visit: Payer: BC Managed Care – PPO | Admitting: Physician Assistant

## 2021-09-30 ENCOUNTER — Telehealth: Payer: Self-pay

## 2021-09-30 ENCOUNTER — Encounter: Payer: Self-pay | Admitting: Cardiology

## 2021-09-30 DIAGNOSIS — R079 Chest pain, unspecified: Secondary | ICD-10-CM | POA: Diagnosis not present

## 2021-09-30 DIAGNOSIS — I10 Essential (primary) hypertension: Secondary | ICD-10-CM | POA: Diagnosis not present

## 2021-09-30 DIAGNOSIS — Z8711 Personal history of peptic ulcer disease: Secondary | ICD-10-CM | POA: Diagnosis not present

## 2021-09-30 DIAGNOSIS — E7800 Pure hypercholesterolemia, unspecified: Secondary | ICD-10-CM | POA: Diagnosis not present

## 2021-09-30 NOTE — Telephone Encounter (Signed)
Around 11:30 pm bp was 150/96 at 8:40 this morning it was 117/85 Pain in left side of chest for about a week now in the upper left side of chest. Pain level currently is 1-2/10. Sporadic. ?Per Marianne Sofia, PA, patient needs to go to UC to be seen. Patient was notified that we do not have any openings for today. Patient was notified to go to UC. His appointment for tomorrow has been canceled and patient stated he will go to the UC. ?

## 2021-09-30 NOTE — Telephone Encounter (Addendum)
Marianne Sofia, PA, stated that being that he is having left side of chest pain he needs to go to the emergency department to be seen so he can get his Troponin level checked. Patient has been notified. ?

## 2021-10-01 ENCOUNTER — Ambulatory Visit: Payer: BC Managed Care – PPO | Admitting: Physician Assistant

## 2021-10-01 DIAGNOSIS — I10 Essential (primary) hypertension: Secondary | ICD-10-CM | POA: Diagnosis not present

## 2021-10-01 DIAGNOSIS — R079 Chest pain, unspecified: Secondary | ICD-10-CM | POA: Diagnosis not present

## 2021-10-01 DIAGNOSIS — Z8711 Personal history of peptic ulcer disease: Secondary | ICD-10-CM | POA: Diagnosis not present

## 2021-10-02 ENCOUNTER — Telehealth: Payer: Self-pay

## 2021-10-02 NOTE — Telephone Encounter (Signed)
Called pt to set up appt 

## 2021-10-11 ENCOUNTER — Encounter: Payer: Self-pay | Admitting: Cardiology

## 2021-10-11 ENCOUNTER — Ambulatory Visit (INDEPENDENT_AMBULATORY_CARE_PROVIDER_SITE_OTHER): Payer: BC Managed Care – PPO | Admitting: Cardiology

## 2021-10-11 VITALS — BP 122/94 | HR 73 | Ht 74.0 in | Wt 199.2 lb

## 2021-10-11 DIAGNOSIS — R079 Chest pain, unspecified: Secondary | ICD-10-CM | POA: Diagnosis not present

## 2021-10-11 NOTE — Progress Notes (Signed)
?Cardiology Office Note:   ? ?Date:  10/11/2021  ? ?ID:  Brad Folks., DOB [birth year], MRN [id number] ? ?PCP:  Marge Duncans, PA-C  ?Cardiologist:  Berniece Salines, DO  ?Electrophysiologist:  None  ? ?Referring MD: Marge Duncans, PA-C  ? ?" I had chest pain" ? ?History of Present Illness:   ? ?Brad Zuniga. is a 51 y.o. male with a hx of hypertension, is here today for follow-up visit.  The patient presented initially after his hospitalization at [facility] for presyncope episodes.  He was worked up no stroke.  We did place a monitor on the patient with no arrhythmias.  He complained of chest pain did a coronary CTA with 0 calcium no evidence of coronary artery disease. ? ?I saw the patient on 11/13/2020 at that time he was experiencing dizziness and I referred the patient to ENT. No  meds changes were made in light of his cardiac condition.  ? ?In the interim he had been admitted to [facility] for chest pain. He had a Lexiscan done which was normal. He was asked to follow up.  ? ?He reports chest pain mostly on exertion. He tells me that the pain at times exacerbates as  ? ?Past Medical History:  ?Diagnosis Date  ? GERD (gastroesophageal reflux disease)   ? ? ?No past surgical history on file. ? ?Current Medications: ?Current Meds  ?Medication Sig  ? esomeprazole (NEXIUM) 40 MG capsule Take 40 mg by mouth daily at 12 noon.  ? fluticasone (FLONASE) 50 MCG/ACT nasal spray SPRAY 2 SPRAYS INTO EACH NOSTRIL EVERY DAY (Patient taking differently: as needed.)  ?  ? ?Allergies:   Patient has no known allergies.  ? ?Social History  ? ?Socioeconomic History  ? Marital status: Married  ?  Spouse name: Not on file  ? Number of children: 2  ? Years of education: Not on file  ? Highest education level: Associate degree: occupational, Hotel manager, or vocational program  ?Occupational History  ? Occupation: Hospital doctor  ?  Employer: [employer]  ?Tobacco Use  ? Smoking status: Never  ?  Smokeless tobacco: Current  ?  Types: Snuff  ?Vaping Use  ? Vaping Use: Never used  ?Substance and Sexual Activity  ? Alcohol use: Not Currently  ? Drug use: Never  ? Sexual activity: Not on file  ?Other Topics Concern  ? Not on file  ?Social History Narrative  ? Patient is right-handed. He lives with his wife and one of his daughters in a 2 story house. He rarely drinks caffeine now, ghe is very active at work.  ? ?Social Determinants of Health  ? ?Financial Resource Strain: Not on file  ?Food Insecurity: Not on file  ?Transportation Needs: Not on file  ?Physical Activity: Not on file  ?Stress: Not on file  ?Social Connections: Not on file  ?  ? ?Family History: ?The patient's family history includes Breast cancer in his sister; Deep vein thrombosis in his father; Diabetes in his father; Heart attack in his father and paternal grandmother; Heart defect in his sister; Hypertension in his father; Lung cancer in his maternal grandfather, maternal grandmother, and paternal grandfather; Thyroid disease in his sister. ? ?ROS:   ?Review of Systems  ?Constitution: Negative for decreased appetite, fever and weight gain.  ?HENT: Negative for congestion, ear discharge, hoarse voice and sore throat.   ?Eyes: Negative for discharge, redness, vision loss in right eye and visual halos.  ?Cardiovascular: Negative  for chest pain, dyspnea on exertion, leg swelling, orthopnea and palpitations.  ?Respiratory: Negative for cough, hemoptysis, shortness of breath and snoring.   ?Endocrine: Negative for heat intolerance and polyphagia.  ?Hematologic/Lymphatic: Negative for bleeding problem. Does not bruise/bleed easily.  ?Skin: Negative for flushing, nail changes, rash and suspicious lesions.  ?Musculoskeletal: Negative for arthritis, joint pain, muscle cramps, myalgias, neck pain and stiffness.  ?Gastrointestinal: Negative for abdominal pain, bowel incontinence, diarrhea and excessive appetite.  ?Genitourinary: Negative for decreased  libido, genital sores and incomplete emptying.  ?Neurological: Negative for brief paralysis, focal weakness, headaches and loss of balance.  ?Psychiatric/Behavioral: Negative for altered mental status, depression and suicidal ideas.  ?Allergic/Immunologic: Negative for HIV exposure and persistent infections.  ? ? ?EKGs/Labs/Other Studies Reviewed:   ? ?The following studies were reviewed today: ? ? ?EKG:  None today  ? ?Lexiscan is normal.   ? ?Recent Labs: ?07/31/2021: ALT 14; BUN 10; Creatinine, Ser 0.98; Hemoglobin 15.1; Platelets 285; Potassium 4.5; Sodium 143  ?Recent Lipid Panel ?   ?Component Value Date/Time  ? CHOL 169 08/15/2020 0920  ? TRIG 74 08/15/2020 0920  ? HDL 39 (L) 08/15/2020 0920  ? CHOLHDL 4.3 08/15/2020 0920  ? [place] 116 (H) 08/15/2020 0920  ? ? ?Physical Exam:   ? ?VS:  BP (!) 122/94   Pulse 73   Ht 6\' 2"  (1.88 m)   Wt 199 lb 3.2 oz (90.4 kg)   SpO2 95%   BMI 25.58 kg/m?    ? ?Wt Readings from Last 3 Encounters:  ?10/11/21 199 lb 3.2 oz (90.4 kg)  ?09/06/21 201 lb (91.2 kg)  ?07/31/21 202 lb 12.8 oz (92 kg)  ?  ? ?GEN: Well nourished, well developed in no acute distress ?HEENT: Normal ?NECK: No JVD; No carotid bruits ?LYMPHATICS: No lymphadenopathy ?CARDIAC: S1S2 noted,RRR, no murmurs, rubs, gallops ?RESPIRATORY:  Clear to auscultation without rales, wheezing or rhonchi  ?ABDOMEN: Soft, non-tender, non-distended, +bowel sounds, no guarding. ?EXTREMITIES: No edema, No cyanosis, no clubbing ?MUSCULOSKELETAL:  No deformity  ?SKIN: Warm and dry ?NEUROLOGIC:  Alert and oriented x 3, non-focal ?PSYCHIATRIC:  Normal affect, good insight ? ?ASSESSMENT:   ? ?1. Chest pain of uncertain etiology   ? ?PLAN:   ? ? ?I reviewed his records from [facility] hospital his nuclear stress test imaging that showed no evidence of ischemia.  But looking at the images they are concerned that he may have hiatal hernia.  I spoke with the patient about this.  He follows with GI-for his, make a follow-up appointment  for them. ? ? ?At this time there is no need to repeat any ischemic evaluation.  However if his symptoms trend differently we can consider coronary CT scan. ? ?Blood pressure is acceptable, continue with current antihypertensive regimen. ? ? ?The patient is in agreement with the above plan. The patient left the office in stable condition.  The patient will follow up in 6 months ? ? ?Medication Adjustments/Labs and Tests Ordered: ?Current medicines are reviewed at length with the patient today.  Concerns regarding medicines are outlined above.  ?No orders of the defined types were placed in this encounter. ? ?No orders of the defined types were placed in this encounter. ? ? ?Patient Instructions  ?Medication Instructions:  ?Your physician recommends that you continue on your current medications as directed. Please refer to the Current Medication list given to you today.  ?*If you need a refill on your cardiac medications before your next appointment, please call your  pharmacy* ? ? ?Lab Work: ?None ?If you have labs (blood work) drawn today and your tests are completely normal, you will receive your results only by: ?MyChart Message (if you have MyChart) OR ?A paper copy in the mail ?If you have any lab test that is abnormal or we need to change your treatment, we will call you to review the results. ? ? ?Testing/Procedures: ?None ? ? ?Follow-Up: ?At [facility], you and your health needs are our priority.  As part of our continuing mission to provide you with exceptional heart care, we have created designated Provider Care Teams.  These Care Teams include your primary Cardiologist (physician) and Advanced Practice Providers (APPs -  Physician Assistants and Nurse Practitioners) who all work together to provide you with the care you need, when you need it. ? ?We recommend signing up for the patient portal called "MyChart".  Sign up information is provided on this After Visit Summary.  MyChart is used to connect  with patients for Virtual Visits (Telemedicine).  Patients are able to view lab/test results, encounter notes, upcoming appointments, etc.  Non-urgent messages can be sent to your provider as well.   ?To learn more abo

## 2021-10-11 NOTE — Patient Instructions (Addendum)
Medication Instructions:  ?Your physician recommends that you continue on your current medications as directed. Please refer to the Current Medication list given to you today.  ?*If you need a refill on your cardiac medications before your next appointment, please call your pharmacy* ? ? ?Lab Work: ?None ?If you have labs (blood work) drawn today and your tests are completely normal, you will receive your results only by: ?MyChart Message (if you have MyChart) OR ?A paper copy in the mail ?If you have any lab test that is abnormal or we need to change your treatment, we will call you to review the results. ? ? ?Testing/Procedures: ?None ? ? ?Follow-Up: ?At [facility], you and your health needs are our priority.  As part of our continuing mission to provide you with exceptional heart care, we have created designated Provider Care Teams.  These Care Teams include your primary Cardiologist (physician) and Advanced Practice Providers (APPs -  Physician Assistants and Nurse Practitioners) who all work together to provide you with the care you need, when you need it. ? ?We recommend signing up for the patient portal called "MyChart".  Sign up information is provided on this After Visit Summary.  MyChart is used to connect with patients for Virtual Visits (Telemedicine).  Patients are able to view lab/test results, encounter notes, upcoming appointments, etc.  Non-urgent messages can be sent to your provider as well.   ?To learn more about what you can do with MyChart, go to [email or web address].   ? ?Your next appointment:   ?6 month(s) ? ?The format for your next appointment:   ?In Person ? ?Provider:   ?Berniece Salines, DO   ? ? ?Other Instructions ? Please make an appt with GI ?

## 2021-10-29 ENCOUNTER — Ambulatory Visit (INDEPENDENT_AMBULATORY_CARE_PROVIDER_SITE_OTHER): Payer: Worker's Compensation | Admitting: Orthopaedic Surgery

## 2021-10-29 ENCOUNTER — Encounter: Payer: Self-pay | Admitting: Orthopaedic Surgery

## 2021-10-29 VITALS — BP 136/90 | HR 62 | Ht 74.0 in | Wt 195.0 lb

## 2021-10-29 DIAGNOSIS — M5126 Other intervertebral disc displacement, lumbar region: Secondary | ICD-10-CM

## 2021-10-29 NOTE — Progress Notes (Signed)
? ?Office Visit Note ?  ?Patient: Brad Zuniga.           ?Date of Birth: [birth year]           ?MRN: [id number] ?Visit Date: 10/29/2021 ?             ?Requested by: Marianne Sofia, PA-C ?[address] ?[address] ?[place],  [place] [address] ?PCP: Marianne Sofia, PA-C ? ? ?Assessment & Plan: ?Visit Diagnoses:  ?1. Protrusion of lumbar intervertebral disc   ? ? ?Plan: MRI scan reviewed.  He is already out of work from another note.  I recommend a single epidural L5-S1 for his disc protrusion.  He does not have severe compression and should be able to avoid surgical intervention for his back.  Reviewed the scan and specifically the L5-S1 disc protrusion and discussed pathophysiology.  He can follow-up with me after his epidural. ? ?Follow-Up Instructions: No follow-ups on file.  ? ?Orders:  ?Orders Placed This Encounter  ?Procedures  ? Ambulatory referral to Physical Medicine Rehab  ? ?No orders of the defined types were placed in this encounter. ? ? ? ? Procedures: ?No procedures performed ? ? ?Clinical Data: ?No additional findings. ? ? ?Subjective: ?Chief Complaint  ?Patient presents with  ? Lower Back - Pain  ?  OTJI 03/26/2021  ? ? ?HPI 51 year old male new patient visit with a Worker's Comp. injury that occurred now 7 months ago.  He is got an MRI scan lumbar spine.  He gave history of falling off a ladder when coming down.  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  He been treated at [facility] for treatment of his knee and buttocks and has not been treated for his back.  MRI scan available on PACS from 04/06/2021 showed some transitional anatomy at the lumbosacral junction left paracentral disc protrusion L5-S1 with annular fissure and moderate left mild right subarticular recess stenosis.  Mild foraminal narrowing at L4-5 although the levels look good.  No fever chills no bowel bladder symptoms.  He has been out of work.  Patient tried a brace on his back which  helped a little bit.  He has had back pain pain that radiates more on the right side of his buttocks down both legs right and left equally.  Occasionally numbness and tingling but not consistent. ?Patient's been out of work and already has a note from [employer]. ?Review of Systems history of [place] spotted fever temporal lobe epilepsy GERD and sometimes problems with headaches.  Cerebral aneurysm without rupture.  All other systems negative. ?Denies previous problems with his back prior to his on-the-job injury September 22. ? ?Objective: ?Vital Signs: BP 136/90   Pulse 62   Ht 6\' 2"  (1.88 m)   Wt 195 lb (88.5 kg)   BMI 25.04 kg/m?  ? ?Physical Exam ?Constitutional:   ?   Appearance: He is well-developed.  ?HENT:  ?   Head: Normocephalic and atraumatic.  ?   Right Ear: External ear normal.  ?   Left Ear: External ear normal.  ?Eyes:  ?   Pupils: Pupils are equal, round, and reactive to light.  ?Neck:  ?   Thyroid: No thyromegaly.  ?   Trachea: No tracheal deviation.  ?Cardiovascular:  ?   Rate and Rhythm: Normal rate.  ?Pulmonary:  ?   Effort: Pulmonary effort is normal.  ?   Breath sounds: No wheezing.  ?  Abdominal:  ?   General: Bowel sounds are normal.  ?   Palpations: Abdomen is soft.  ?Musculoskeletal:  ?   Cervical back: Neck supple.  ?Skin: ?   General: Skin is warm and dry.  ?   Capillary Refill: Capillary refill takes less than 2 seconds.  ?Neurological:  ?   Mental Status: He is alert and oriented to person, place, and time.  ?Psychiatric:     ?   Behavior: Behavior normal.     ?   Thought Content: Thought content normal.     ?   Judgment: Judgment normal.  ? ? ?Ortho Exam patient has negative logroll the hips he has some pain with straight leg raising knee and ankle jerk are intact he is able to heel and toe walk.  Tenderness palpation lumbar spine. ? ?Specialty Comments:  ?No specialty comments available. ? ?Imaging: ?No results found. ? ? ?PMFS History: ?Patient Active Problem List  ?  Diagnosis Date Noted  ? Lymphadenitis 04/15/2021  ? 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 GERD (gastroesophageal reflux disease)   ? Dyspnea 07/25/2020  ? Chest pain 07/25/2020  ? Cough 07/24/2020  ? Other fatigue 07/24/2020  ? Rectal bleeding 09/15/2019  ? Generalized abdominal pain 09/15/2019  ? Acute laryngopharyngitis 09/15/2019  ? Cerebral aneurysm without rupture 01/12/2018  ? Chronic daily headache 01/12/2018  ? Postural dizziness with presyncope 01/12/2018  ? [facility] spotted fever) 01/12/2018  ? ?Past Medical History:  ?Diagnosis Date  ? GERD (gastroesophageal reflux disease)   ?  ?Family History  ?Problem Relation Age of Onset  ? Heart attack Father   ? Diabetes Father   ? Hypertension Father   ? Deep vein thrombosis Father   ? Breast cancer Sister   ? Heart defect Sister   ? Thyroid disease Sister   ? Lung cancer Maternal Grandmother   ? Lung cancer Maternal Grandfather   ? Heart attack Paternal Grandmother   ? Lung cancer Paternal Grandfather   ?  ?No past surgical history on file. ?Social History  ? ?Occupational History  ? Occupation: Engineer, production  ?  Employer: [employer]  ?Tobacco Use  ? Smoking status: Never  ? Smokeless tobacco: Current  ?  Types: Snuff  ?Vaping Use  ? Vaping Use: Never used  ?Substance and Sexual Activity  ? Alcohol use: Not Currently  ? Drug use: Never  ? Sexual activity: Not on file  ? ? ? ? ? ? ?

## 2021-12-24 DIAGNOSIS — M5451 Vertebrogenic low back pain: Secondary | ICD-10-CM | POA: Diagnosis not present

## 2021-12-25 ENCOUNTER — Telehealth: Payer: Self-pay

## 2021-12-25 ENCOUNTER — Telehealth: Payer: Self-pay | Admitting: Physical Medicine and Rehabilitation

## 2021-12-25 NOTE — Telephone Encounter (Signed)
NA

## 2021-12-25 NOTE — Telephone Encounter (Signed)
Pt called to reschedule appt. Please call pt at [phone]

## 2021-12-26 ENCOUNTER — Encounter: Payer: BC Managed Care – PPO | Admitting: Physical Medicine and Rehabilitation

## 2021-12-27 ENCOUNTER — Telehealth: Payer: Self-pay

## 2021-12-27 DIAGNOSIS — K625 Hemorrhage of anus and rectum: Secondary | ICD-10-CM | POA: Diagnosis not present

## 2021-12-27 DIAGNOSIS — D649 Anemia, unspecified: Secondary | ICD-10-CM | POA: Diagnosis not present

## 2021-12-27 DIAGNOSIS — D72819 Decreased white blood cell count, unspecified: Secondary | ICD-10-CM | POA: Diagnosis not present

## 2021-12-27 NOTE — Telephone Encounter (Signed)
Patient calling complaining of blood in BM. Patient has been having blood in toilet when having BMs for last month. It has stayed the same. He explains it as the whole toilet is filled with blood and feels it drains. Recommended patient go to urgent care or ED and follow up next week. Patient VU and will be evaluated.   Lorita Officer, CCMA 12/27/21 11:55 AM

## 2021-12-30 ENCOUNTER — Encounter: Payer: Self-pay | Admitting: Physician Assistant

## 2021-12-30 ENCOUNTER — Ambulatory Visit: Payer: BC Managed Care – PPO | Admitting: Physician Assistant

## 2021-12-30 VITALS — BP 102/74 | HR 93 | Temp 98.1°F | Resp 18 | Ht 74.0 in | Wt 191.2 lb

## 2021-12-30 DIAGNOSIS — K648 Other hemorrhoids: Secondary | ICD-10-CM

## 2021-12-30 DIAGNOSIS — R899 Unspecified abnormal finding in specimens from other organs, systems and tissues: Secondary | ICD-10-CM

## 2021-12-30 DIAGNOSIS — K625 Hemorrhage of anus and rectum: Secondary | ICD-10-CM | POA: Diagnosis not present

## 2021-12-30 MED ORDER — HYDROCORTISONE ACETATE 25 MG RE SUPP: 25.0000 mg | Freq: Two times a day (BID) | RECTAL | 1 refills | Status: DC

## 2021-12-30 NOTE — Progress Notes (Signed)
Acute Office Visit  Subjective:    Patient ID: Brad Dunklee., male    DOB: [birth year], 51 y.o.   MRN: [id number]  Chief Complaint  Patient presents with   Blood In Stools    HPI: Patient is in today for complaints of rectal bleeding.  Pt states that he has had intermittent problems with rectal bleeding and actually saw GI about a year ago and had a normal endoscopy and colonoscopy except for fact that he has hemorroids.  Pt was seen at Urgent Care last week for these symptoms and was told to follow up ASAP for abnormal labwork However his WBC was 3.7, RBC was 4.93, Hct 40.8 and hgb 14.0  Although some are minimally low none are at critical value Pt denies abdominal pain, nausea, vomiting  Past Medical History:  Diagnosis Date   GERD (gastroesophageal reflux disease)     History reviewed. No pertinent surgical history.  Family History  Problem Relation Age of Onset   Heart attack Father    Diabetes Father    Hypertension Father    Deep vein thrombosis Father    Breast cancer Sister    Heart defect Sister    Thyroid disease Sister    Lung cancer Maternal Grandmother    Lung cancer Maternal Grandfather    Heart attack Paternal Grandmother    Lung cancer Paternal Grandfather     Social History   Socioeconomic History   Marital status: Married    Spouse name: Not on file   Number of children: 2   Years of education: Not on file   Highest education level: Associate degree: occupational, Hotel manager, or vocational program  Occupational History   Occupation: Automotive engineer: Orthoptist  Tobacco Use   Smoking status: Never   Smokeless tobacco: Current    Types: Snuff  Vaping Use   Vaping Use: Never used  Substance and Sexual Activity   Alcohol use: Not Currently   Drug use: Never   Sexual activity: Not on file  Other Topics Concern   Not on file  Social History Narrative   Patient is right-handed. He lives with his wife and one of  his daughters in a 2 story house. He rarely drinks caffeine now, ghe is very active at work.   Social Determinants of Health   Financial Resource Strain: Not on file  Food Insecurity: Not on file  Transportation Needs: Not on file  Physical Activity: Not on file  Stress: Not on file  Social Connections: Not on file  Intimate Partner Violence: Not on file    Outpatient Medications Prior to Visit  Medication Sig Dispense Refill   esomeprazole (NEXIUM) 40 MG capsule Take 40 mg by mouth daily at 12 noon.     fluticasone (FLONASE) 50 MCG/ACT nasal spray SPRAY 2 SPRAYS INTO EACH NOSTRIL EVERY DAY (Patient taking differently: as needed.) 48 mL 3   No facility-administered medications prior to visit.    No Known Allergies  Review of Systems CONSTITUTIONAL: Negative for chills, fatigue, fever, unintentional weight gain and unintentional weight loss.   CARDIOVASCULAR: Negative for chest pain, dizziness,  RESPIRATORY: Negative for recent cough and dyspnea.  GASTROINTESTINAL: Negative for abdominal pain, acid reflux symptoms, constipation, diarrhea, nausea and vomiting.  - see HPI         Objective:    Physical Exam PHYSICAL EXAM:   VS: BP 102/74   Pulse 93   Temp 98.1 F (36.7 C)  Resp 18   Ht 6\' 2"  (1.88 m)   Wt 191 lb 3.2 oz (86.7 kg)   SpO2 97%   BMI 24.55 kg/m   GEN: Well nourished, well developed, in no acute distress   Cardiac: RRR; no murmurs,  Respiratory:  normal respiratory rate and pattern with no distress - normal breath sounds with no rales, rhonchi, wheezes or rubs GI: normal bowel sounds, no masses or tenderness MS: no deformity or atrophy  Skin: warm and dry, no rash    BP 102/74   Pulse 93   Temp 98.1 F (36.7 C)   Resp 18   Ht 6\' 2"  (1.88 m)   Wt 191 lb 3.2 oz (86.7 kg)   SpO2 97%   BMI 24.55 kg/m  Wt Readings from Last 3 Encounters:  12/30/21 191 lb 3.2 oz (86.7 kg)  10/29/21 195 lb (88.5 kg)  10/11/21 199 lb 3.2 oz (90.4 kg)    There  are no preventive care reminders to display for this patient.  There are no preventive care reminders to display for this patient.   Lab Results  Component Value Date   TSH 2.010 09/28/2020   Lab Results  Component Value Date   WBC 6.7 07/31/2021   HGB 15.1 07/31/2021   HCT 44.1 07/31/2021   MCV 88 07/31/2021   PLT 285 07/31/2021   Lab Results  Component Value Date   NA 143 07/31/2021   K 4.5 07/31/2021   CO2 23 07/31/2021   GLUCOSE 112 (H) 07/31/2021   BUN 10 07/31/2021   CREATININE 0.98 07/31/2021   BILITOT 0.3 07/31/2021   ALKPHOS 71 07/31/2021   AST 17 07/31/2021   ALT 14 07/31/2021   PROT 7.3 07/31/2021   ALBUMIN 4.8 07/31/2021   CALCIUM 10.1 07/31/2021   ANIONGAP 7 12/20/2017   EGFR 93 07/31/2021   Lab Results  Component Value Date   CHOL 169 08/15/2020   Lab Results  Component Value Date   HDL 39 (L) 08/15/2020   Lab Results  Component Value Date   LDLCALC 116 (H) 08/15/2020   Lab Results  Component Value Date   TRIG 74 08/15/2020   Lab Results  Component Value Date   CHOLHDL 4.3 08/15/2020   Lab Results  Component Value Date   HGBA1C 5.9 (H) 02/18/2021       Assessment & Plan:   Problem List Items Addressed This Visit       Digestive   Rectal bleeding - Primary   Relevant Orders   CBC with Differential/Platelet   Comprehensive metabolic panel   Iron, TIBC and Ferritin Panel   Other Visit Diagnoses     Abnormal laboratory test result       Relevant Orders   CBC with Differential/Platelet   Comprehensive metabolic panel   Iron, TIBC and Ferritin Panel   Other hemorrhoids       Relevant Medications   hydrocortisone (ANUSOL-HC) 25 MG suppository      Meds ordered this encounter  Medications   hydrocortisone (ANUSOL-HC) 25 MG suppository    Sig: Place 1 suppository (25 mg total) rectally 2 (two) times daily.    Dispense:  12 suppository    Refill:  1    Order Specific Question:   Supervising Provider    Answer04/08/2020    Orders Placed This Encounter  Procedures   CBC with Differential/Platelet   Comprehensive metabolic panel   Iron, TIBC and Ferritin Panel  Follow-up: Return if symptoms worsen or fail to improve.  An After Visit Summary was printed and given to the patient.  Yetta Flock [facility] [phone]

## 2021-12-31 ENCOUNTER — Telehealth: Payer: Self-pay

## 2021-12-31 LAB — COMPREHENSIVE METABOLIC PANEL
ALT: 16 IU/L (ref 0–44)
AST: 15 IU/L (ref 0–40)
Albumin/Globulin Ratio: 2 (ref 1.2–2.2)
Albumin: 4.8 g/dL (ref 3.8–4.9)
Alkaline Phosphatase: 64 IU/L (ref 44–121)
BUN/Creatinine Ratio: 9 (ref 9–20)
BUN: 9 mg/dL (ref 6–24)
Bilirubin Total: 0.5 mg/dL (ref 0.0–1.2)
CO2: 25 mmol/L (ref 20–29)
Calcium: 9.8 mg/dL (ref 8.7–10.2)
Chloride: 103 mmol/L (ref 96–106)
Creatinine, Ser: 1.01 mg/dL (ref 0.76–1.27)
Globulin, Total: 2.4 g/dL (ref 1.5–4.5)
Glucose: 115 mg/dL — ABNORMAL HIGH (ref 70–99)
Potassium: 4.3 mmol/L (ref 3.5–5.2)
Sodium: 143 mmol/L (ref 134–144)
Total Protein: 7.2 g/dL (ref 6.0–8.5)
eGFR: 90 mL/min/{1.73_m2} (ref >59)

## 2021-12-31 LAB — CBC WITH DIFFERENTIAL/PLATELET
Basophils Absolute: 0 10*3/uL (ref 0.0–0.2)
Basos: 1 %
EOS (ABSOLUTE): 0.1 10*3/uL (ref 0.0–0.4)
Eos: 1 %
Hematocrit: 43.5 % (ref 37.5–51.0)
Hemoglobin: 14.5 g/dL (ref 13.0–17.7)
Immature Grans (Abs): 0 10*3/uL (ref 0.0–0.1)
Immature Granulocytes: 0 %
Lymphocytes Absolute: 1.6 10*3/uL (ref 0.7–3.1)
Lymphs: 29 %
MCH: 29.8 pg (ref 26.6–33.0)
MCHC: 33.3 g/dL (ref 31.5–35.7)
MCV: 90 fL (ref 79–97)
Monocytes Absolute: 0.4 10*3/uL (ref 0.1–0.9)
Monocytes: 7 %
Neutrophils Absolute: 3.4 10*3/uL (ref 1.4–7.0)
Neutrophils: 62 %
Platelets: 321 10*3/uL (ref 150–450)
RBC: 4.86 x10E6/uL (ref 4.14–5.80)
RDW: 12.8 % (ref 11.6–15.4)
WBC: 5.4 10*3/uL (ref 3.4–10.8)

## 2021-12-31 LAB — IRON,TIBC AND FERRITIN PANEL
Ferritin: 25 ng/mL — ABNORMAL LOW (ref 30–400)
Iron Saturation: 22 % (ref 15–55)
Iron: 83 ug/dL (ref 38–169)
Total Iron Binding Capacity: 380 ug/dL (ref 250–450)
UIBC: 297 ug/dL (ref 111–343)

## 2021-12-31 NOTE — Telephone Encounter (Signed)
Can try over the counter suppositories

## 2021-12-31 NOTE — Telephone Encounter (Signed)
Patient was unable to pick up suppository's. Insurance does not cover this and it is over $100. He is requesting alternative recommendation.   Lorita Officer, [place] 12/31/21 10:33 AM

## 2021-12-31 NOTE — Telephone Encounter (Signed)
Patient aware.  Lorita Officer, CCMA 12/31/21 11:41 AM

## 2022-01-24 ENCOUNTER — Telehealth: Payer: Self-pay | Admitting: Physical Medicine and Rehabilitation

## 2022-01-24 NOTE — Telephone Encounter (Signed)
Brad Zuniga has questions regarding the pre/post care for his epidural injection on Monday. Please advise

## 2022-01-27 ENCOUNTER — Ambulatory Visit (INDEPENDENT_AMBULATORY_CARE_PROVIDER_SITE_OTHER): Payer: Worker's Compensation | Admitting: Physical Medicine and Rehabilitation

## 2022-01-27 ENCOUNTER — Ambulatory Visit: Payer: Self-pay

## 2022-01-27 ENCOUNTER — Encounter: Payer: Self-pay | Admitting: Physical Medicine and Rehabilitation

## 2022-01-27 VITALS — BP 142/95 | HR 81

## 2022-01-27 DIAGNOSIS — M5416 Radiculopathy, lumbar region: Secondary | ICD-10-CM | POA: Diagnosis not present

## 2022-01-27 MED ORDER — METHYLPREDNISOLONE ACETATE 80 MG/ML IJ SUSP
80.0000 mg | Freq: Once | INTRAMUSCULAR | Status: AC
  Administered 2022-01-27: 80 mg

## 2022-01-27 NOTE — Progress Notes (Signed)
Pt state lower back pain that travels to his right hip and down both legs. Pt state sitting and laying down makes the pain worse. Pt state he uses bio freeze, tens unit and pain meds to help ease his pain.  Numeric Pain Rating Scale and Functional Assessment Average Pain 5   In the last MONTH (on 0-10 scale) has pain interfered with the following?  1. General activity like being  able to carry out your everyday physical activities such as walking, climbing stairs, carrying groceries, or moving a chair?  Rating(10)   +Driver, -BT, -Dye Allergies.

## 2022-01-27 NOTE — Progress Notes (Signed)
Brad Zuniga. - 51 y.o. male MRN [id number]  Date of birth: April 15, 1971  Office Visit Note: Visit Date: 01/27/2022 PCP: Marianne Sofia, PA-C Referred by: Eldred Manges, MD  Subjective: Chief Complaint  Patient presents with   Lower Back - Pain   Right Hip - Pain   Right Leg - Pain   Left Leg - Pain   HPI:  Brad Mcqueen. is a 51 y.o. male who comes in today at the request of Dr. Annell Greening for planned Right L5-S1 Lumbar Interlaminar epidural steroid injection with fluoroscopic guidance.  The patient has failed conservative care including home exercise, medications, time and activity modification.  This injection will be diagnostic and hopefully therapeutic.  Please see requesting physician notes for further details and justification.   ROS Otherwise per HPI.  Assessment & Plan: Visit Diagnoses:    ICD-10-CM   1. Lumbar radiculopathy  M54.16 XR C-ARM NO REPORT    Epidural Steroid injection    methylPREDNISolone acetate (DEPO-MEDROL) injection 80 mg      Plan: No additional findings.   Meds & Orders:  Meds ordered this encounter  Medications   methylPREDNISolone acetate (DEPO-MEDROL) injection 80 mg    Orders Placed This Encounter  Procedures   XR C-ARM NO REPORT   Epidural Steroid injection    Follow-up: Return in about 2 weeks (around 02/10/2022) for Annell Greening, MD.   Procedures: No procedures performed  Lumbar Epidural Steroid Injection - Interlaminar Approach with Fluoroscopic Guidance  Patient: Brad Zuniga.      Date of Birth: 1971/02/23 MRN: [id number] PCP: Marianne Sofia, PA-C      Visit Date: 01/27/2022   Universal Protocol:     Consent Given By: the patient  Position: PRONE  Additional Comments: Vital signs were monitored before and after the procedure. Patient was prepped and draped in the usual sterile fashion. The correct patient, procedure, and site was verified.   Injection Procedure Details:   Procedure diagnoses: Lumbar radiculopathy [M54.16]    Meds Administered:  Meds ordered this encounter  Medications   methylPREDNISolone acetate (DEPO-MEDROL) injection 80 mg     Laterality: Left  Location/Site:  L5-S1  Needle: 3.5 in., 20 ga. Tuohy  Needle Placement: Paramedian epidural  Findings:   -Comments: Excellent flow of contrast into the epidural space.  Procedure Details: Using a paramedian approach from the side mentioned above, the region overlying the inferior lamina was localized under fluoroscopic visualization and the soft tissues overlying this structure were infiltrated with 4 ml. of 1% Lidocaine without Epinephrine. The Tuohy needle was inserted into the epidural space using a paramedian approach.   The epidural space was localized using loss of resistance along with counter oblique bi-planar fluoroscopic views.  After negative aspirate for air, blood, and CSF, a 2 ml. volume of Isovue-250 was injected into the epidural space and the flow of contrast was observed. Radiographs were obtained for documentation purposes.    The injectate was administered into the level noted above.   Additional Comments:  No complications occurred Dressing: 2 x 2 sterile gauze and Band-Aid    Post-procedure details: Patient was observed during the procedure. Post-procedure instructions were reviewed.  Patient left the clinic in stable condition.   Clinical History: MRI LUMBAR SPINE WITHOUT CONTRAST   TECHNIQUE: Multiplanar, multisequence MR imaging of the lumbar spine was performed. No intravenous contrast was administered.   COMPARISON:  X-ray 03/26/2021   FINDINGS: Segmentation: Based on comparison images including chest x-ray from 07/25/2020  and CT abdomen pelvis from 09/14/2019, there are 12 rib-bearing thoracic type vertebral segments and 5 lumbar type vertebral segments with a near fully lumbarized transitional S1 segment and a well developed disc space at the S1-S2 level.   Alignment:  Physiologic.    Vertebrae:  No fracture, evidence of discitis, or bone lesion.   Conus medullaris and cauda equina: Conus extends to the L1 level. Conus and cauda equina appear normal.   Paraspinal and other soft tissues: Negative.   Disc levels:   T12-L1: No significant disc protrusion, foraminal stenosis, or canal stenosis.   L1-L2: No significant disc protrusion, foraminal stenosis, or canal stenosis.   L2-L3: No significant disc protrusion, foraminal stenosis, or canal stenosis.   L3-L4: No significant disc protrusion, foraminal stenosis, or canal stenosis.   L4-L5: Mild annular disc bulge, slightly eccentric to the right resulting in mild right foraminal stenosis. No canal stenosis.   L5-S1: Left paracentral disc protrusion with annular fissure. Minimal facet arthropathy. There is moderate left and mild right subarticular recess stenosis without canal stenosis. No significant foraminal stenosis.   S1-S2: Shallow central disc protrusion without canal or foraminal stenosis.   IMPRESSION: 1. 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 2. Left paracentral disc protrusion at L5-S1 with annular fissure resulting in moderate left and mild right subarticular recess stenosis without canal stenosis. 3. Mild right foraminal stenosis at L4-L5. 4. No canal stenosis at any level.     Electronically Signed   By: Duanne Guess D.O.   On: 04/06/2021 16:51     Objective:  VS:  HT:    WT:   BMI:     BP:(!) 142/95  HR:81bpm  TEMP: ( )  RESP:  Physical Exam Vitals and nursing note reviewed.  Constitutional:      General: He is not in acute distress.    Appearance: Normal appearance. He is not ill-appearing.  HENT:     Head: Normocephalic and atraumatic.     Right Ear: External ear normal.     Left Ear: External ear normal.     Nose: No congestion.  Eyes:     Extraocular Movements: Extraocular movements intact.   Cardiovascular:     Rate and Rhythm: Normal rate.     Pulses: Normal pulses.  Pulmonary:     Effort: Pulmonary effort is normal. No respiratory distress.  Abdominal:     General: There is no distension.     Palpations: Abdomen is soft.  Musculoskeletal:        General: No tenderness or signs of injury.     Cervical back: Neck supple.     Right lower leg: No edema.     Left lower leg: No edema.     Comments: Patient has good distal strength without clonus.  Skin:    Findings: No erythema or rash.  Neurological:     General: No focal deficit present.     Mental Status: He is alert and oriented to person, place, and time.     Sensory: No sensory deficit.     Motor: No weakness or abnormal muscle tone.     Coordination: Coordination normal.  Psychiatric:        Mood and Affect: Mood normal.        Behavior: Behavior normal.      Imaging: No results found.

## 2022-01-27 NOTE — Patient Instructions (Signed)

## 2022-02-03 NOTE — Procedures (Signed)
Lumbar Epidural Steroid Injection - Interlaminar Approach with Fluoroscopic Guidance  Patient: Brad Zuniga.      Date of Birth: [birth year] MRN: [id number] PCP: Marianne Sofia, PA-C      Visit Date: 01/27/2022   Universal Protocol:     Consent Given By: the patient  Position: PRONE  Additional Comments: Vital signs were monitored before and after the procedure. Patient was prepped and draped in the usual sterile fashion. The correct patient, procedure, and site was verified.   Injection Procedure Details:   Procedure diagnoses: Lumbar radiculopathy [M54.16]   Meds Administered:  Meds ordered this encounter  Medications   methylPREDNISolone acetate (DEPO-MEDROL) injection 80 mg     Laterality: Left  Location/Site:  L5-S1  Needle: 3.5 in., 20 ga. Tuohy  Needle Placement: Paramedian epidural  Findings:   -Comments: Excellent flow of contrast into the epidural space.  Procedure Details: Using a paramedian approach from the side mentioned above, the region overlying the inferior lamina was localized under fluoroscopic visualization and the soft tissues overlying this structure were infiltrated with 4 ml. of 1% Lidocaine without Epinephrine. The Tuohy needle was inserted into the epidural space using a paramedian approach.   The epidural space was localized using loss of resistance along with counter oblique bi-planar fluoroscopic views.  After negative aspirate for air, blood, and CSF, a 2 ml. volume of Isovue-250 was injected into the epidural space and the flow of contrast was observed. Radiographs were obtained for documentation purposes.    The injectate was administered into the level noted above.   Additional Comments:  No complications occurred Dressing: 2 x 2 sterile gauze and Band-Aid    Post-procedure details: Patient was observed during the procedure. Post-procedure instructions were reviewed.  Patient left the clinic in stable condition.

## 2022-02-05 DIAGNOSIS — M5451 Vertebrogenic low back pain: Secondary | ICD-10-CM | POA: Diagnosis not present

## 2022-02-18 ENCOUNTER — Ambulatory Visit: Payer: BC Managed Care – PPO | Admitting: Orthopaedic Surgery

## 2022-03-03 ENCOUNTER — Ambulatory Visit: Payer: BC Managed Care – PPO | Admitting: Physician Assistant

## 2022-03-03 ENCOUNTER — Ambulatory Visit: Payer: BC Managed Care – PPO | Admitting: Nurse Practitioner

## 2022-03-03 ENCOUNTER — Encounter: Payer: Self-pay | Admitting: Physician Assistant

## 2022-03-03 VITALS — BP 110/78 | HR 90 | Temp 97.4°F | Ht 74.0 in | Wt 190.0 lb

## 2022-03-03 DIAGNOSIS — R1084 Generalized abdominal pain: Secondary | ICD-10-CM | POA: Diagnosis not present

## 2022-03-03 DIAGNOSIS — R5383 Other fatigue: Secondary | ICD-10-CM | POA: Diagnosis not present

## 2022-03-03 NOTE — Progress Notes (Signed)
Acute Office Visit  Subjective:    Patient ID: Brad Zuniga., male    DOB: Aug 26, 1970, 51 y.o.   MRN: [id number]  Chief Complaint  Patient presents with   GI Problem    HPI: Patient is in today for complaints of fatigue and abdominal pain.  Pt states last week he had some burning in his stomach and heaviness in stomach  - he had associated nausea and loose bowels but symptoms have mostly subsided He does take nexium for hiatal hernia and had colonooscopy/endoscopy last year He denies urine symptoms States he has had general malaise and tiredness since last week Denies chest pain/sob Past Medical History:  Diagnosis Date   GERD (gastroesophageal reflux disease)     History reviewed. No pertinent surgical history.  Family History  Problem Relation Age of Onset   Heart attack Father    Diabetes Father    Hypertension Father    Deep vein thrombosis Father    Breast cancer Sister    Heart defect Sister    Thyroid disease Sister    Lung cancer Maternal Grandmother    Lung cancer Maternal Grandfather    Heart attack Paternal Grandmother    Lung cancer Paternal Grandfather     Social History   Socioeconomic History   Marital status: Married    Spouse name: Not on file   Number of children: 2   Years of education: Not on file   Highest education level: Associate degree: occupational, Hotel manager, or vocational program  Occupational History   Occupation: Automotive engineer: Orthoptist  Tobacco Use   Smoking status: Never   Smokeless tobacco: Current    Types: Snuff  Vaping Use   Vaping Use: Never used  Substance and Sexual Activity   Alcohol use: Not Currently   Drug use: Never   Sexual activity: Not on file  Other Topics Concern   Not on file  Social History Narrative   Patient is right-handed. He lives with his wife and one of his daughters in a 2 story house. He rarely drinks caffeine now, ghe is very active at work.   Social  Determinants of Health   Financial Resource Strain: Not on file  Food Insecurity: Not on file  Transportation Needs: Not on file  Physical Activity: Not on file  Stress: Not on file  Social Connections: Not on file  Intimate Partner Violence: Not on file    Outpatient Medications Prior to Visit  Medication Sig Dispense Refill   esomeprazole (NEXIUM) 40 MG capsule Take 40 mg by mouth daily at 12 noon.     fluticasone (FLONASE) 50 MCG/ACT nasal spray SPRAY 2 SPRAYS INTO EACH NOSTRIL EVERY DAY (Patient taking differently: as needed.) 48 mL 3   hydrocortisone (ANUSOL-HC) 25 MG suppository Place 1 suppository (25 mg total) rectally 2 (two) times daily. 12 suppository 1   No facility-administered medications prior to visit.    No Known Allergies  Review of Systems CONSTITUTIONAL: see HPI  CARDIOVASCULAR: Negative for chest pain, dizziness, palpitations and pedal edema.  RESPIRATORY: Negative for recent cough and dyspnea.  GASTROINTESTINAL:see HPI MSK: Negative for arthralgias and myalgias.  INTEGUMENTARY: Negative for rash.   PSYCHIATRIC: Negative for sleep disturbance and to question depression screen.  Negative for depression, negative for anhedonia.         Objective:    PHYSICAL EXAM:   VS: BP 110/78 (BP Location: Left Arm, Patient Position: Sitting, Cuff Size: Normal)  Pulse 90   Temp (!) 97.4 F (36.3 C) (Temporal)   Ht 6' 2"  (1.88 m)   Wt 190 lb (86.2 kg)   SpO2 97%   BMI 24.39 kg/m   GEN: Well nourished, well developed, in no acute distress   Cardiac: RRR; no murmurs, rubs, or gallops,no edema - Respiratory:  normal respiratory rate and pattern with no distress - normal breath sounds with no rales, rhonchi, wheezes or rubs GI: normal bowel sounds, no masses or tenderness  Skin: warm and dry, no rash   Psych: euthymic mood, appropriate affect and demeanor    There are no preventive care reminders to display for this patient.  There are no preventive  care reminders to display for this patient.   Lab Results  Component Value Date   TSH 2.010 09/28/2020   Lab Results  Component Value Date   WBC 5.4 12/30/2021   HGB 14.5 12/30/2021   HCT 43.5 12/30/2021   MCV 90 12/30/2021   PLT 321 12/30/2021   Lab Results  Component Value Date   NA 143 12/30/2021   K 4.3 12/30/2021   CO2 25 12/30/2021   GLUCOSE 115 (H) 12/30/2021   BUN 9 12/30/2021   CREATININE 1.01 12/30/2021   BILITOT 0.5 12/30/2021   ALKPHOS 64 12/30/2021   AST 15 12/30/2021   ALT 16 12/30/2021   PROT 7.2 12/30/2021   ALBUMIN 4.8 12/30/2021   CALCIUM 9.8 12/30/2021   ANIONGAP 7 12/20/2017   EGFR 90 12/30/2021   Lab Results  Component Value Date   CHOL 169 08/15/2020   Lab Results  Component Value Date   HDL 39 (L) 08/15/2020   Lab Results  Component Value Date   LDLCALC 116 (H) 08/15/2020   Lab Results  Component Value Date   TRIG 74 08/15/2020   Lab Results  Component Value Date   CHOLHDL 4.3 08/15/2020   Lab Results  Component Value Date   HGBA1C 5.9 (H) 02/18/2021       Assessment & Plan:   Problem List Items Addressed This Visit       Other   Generalized abdominal pain - Primary   Relevant Orders   CBC with Differential/Platelet   Comprehensive metabolic panel   TSH   Helicobacter pylori abs-IgG+IgA, bld Recommend bland diet   Other fatigue   Relevant Orders   CBC with Differential/Platelet   Comprehensive metabolic panel   TSH   No orders of the defined types were placed in this encounter.   Orders Placed This Encounter  Procedures   CBC with Differential/Platelet   Comprehensive metabolic panel   TSH   Helicobacter pylori abs-IgG+IgA, bld     Follow-up: Return if symptoms worsen or fail to improve.  An After Visit Summary was printed and given to the patient.  Yetta Flock [facility] [phone]

## 2022-03-04 LAB — COMPREHENSIVE METABOLIC PANEL
ALT: 16 IU/L (ref 0–44)
AST: 16 IU/L (ref 0–40)
Albumin/Globulin Ratio: 2 (ref 1.2–2.2)
Albumin: 4.6 g/dL (ref 3.8–4.9)
Alkaline Phosphatase: 72 IU/L (ref 44–121)
BUN/Creatinine Ratio: 9 (ref 9–20)
BUN: 10 mg/dL (ref 6–24)
Bilirubin Total: 0.6 mg/dL (ref 0.0–1.2)
CO2: 23 mmol/L (ref 20–29)
Calcium: 9.8 mg/dL (ref 8.7–10.2)
Chloride: 100 mmol/L (ref 96–106)
Creatinine, Ser: 1.06 mg/dL (ref 0.76–1.27)
Globulin, Total: 2.3 g/dL (ref 1.5–4.5)
Glucose: 107 mg/dL — ABNORMAL HIGH (ref 70–99)
Potassium: 3.9 mmol/L (ref 3.5–5.2)
Sodium: 139 mmol/L (ref 134–144)
Total Protein: 6.9 g/dL (ref 6.0–8.5)
eGFR: 85 mL/min/{1.73_m2} (ref >59)

## 2022-03-04 LAB — CBC WITH DIFFERENTIAL/PLATELET
Basophils Absolute: 0 10*3/uL (ref 0.0–0.2)
Basos: 1 %
EOS (ABSOLUTE): 0.1 10*3/uL (ref 0.0–0.4)
Eos: 2 %
Hematocrit: 42.2 % (ref 37.5–51.0)
Hemoglobin: 14 g/dL (ref 13.0–17.7)
Immature Grans (Abs): 0 10*3/uL (ref 0.0–0.1)
Immature Granulocytes: 0 %
Lymphocytes Absolute: 1.1 10*3/uL (ref 0.7–3.1)
Lymphs: 29 %
MCH: 29.6 pg (ref 26.6–33.0)
MCHC: 33.2 g/dL (ref 31.5–35.7)
MCV: 89 fL (ref 79–97)
Monocytes Absolute: 0.3 10*3/uL (ref 0.1–0.9)
Monocytes: 8 %
Neutrophils Absolute: 2.4 10*3/uL (ref 1.4–7.0)
Neutrophils: 60 %
Platelets: 276 10*3/uL (ref 150–450)
RBC: 4.73 x10E6/uL (ref 4.14–5.80)
RDW: 12.7 % (ref 11.6–15.4)
WBC: 3.9 10*3/uL (ref 3.4–10.8)

## 2022-03-04 LAB — HELICOBACTER PYLORI ABS-IGG+IGA, BLD
H. pylori, IgA Abs: 15.6 units — ABNORMAL HIGH (ref 0.0–8.9)
H. pylori, IgG AbS: 0.36 Index Value (ref 0.00–0.79)

## 2022-03-04 LAB — TSH: TSH: 1.62 u[IU]/mL (ref 0.450–4.500)

## 2022-03-05 ENCOUNTER — Other Ambulatory Visit: Payer: Self-pay | Admitting: Physician Assistant

## 2022-03-05 DIAGNOSIS — A048 Other specified bacterial intestinal infections: Secondary | ICD-10-CM

## 2022-03-05 MED ORDER — CLARITHROMYCIN 500 MG PO TABS: 500.0000 mg | ORAL_TABLET | Freq: Two times a day (BID) | ORAL | 0 refills | Status: DC

## 2022-03-05 MED ORDER — AMOXICILLIN 875 MG PO TABS: 875.0000 mg | ORAL_TABLET | Freq: Two times a day (BID) | ORAL | 0 refills | Status: DC

## 2022-03-13 ENCOUNTER — Other Ambulatory Visit: Payer: Self-pay

## 2022-03-13 DIAGNOSIS — J329 Chronic sinusitis, unspecified: Secondary | ICD-10-CM

## 2022-03-13 MED ORDER — FLUTICASONE PROPIONATE 50 MCG/ACT NA SUSP: 2.0000 | Freq: Every day | NASAL | 3 refills | Status: DC

## 2022-04-04 ENCOUNTER — Ambulatory Visit: Payer: BC Managed Care – PPO | Admitting: Physician Assistant

## 2022-04-05 ENCOUNTER — Other Ambulatory Visit: Payer: Self-pay | Admitting: Family Medicine

## 2022-04-05 DIAGNOSIS — J329 Chronic sinusitis, unspecified: Secondary | ICD-10-CM

## 2022-04-18 ENCOUNTER — Other Ambulatory Visit: Payer: Self-pay | Admitting: Physician Assistant

## 2022-04-18 MED ORDER — AZITHROMYCIN 250 MG PO TABS: ORAL_TABLET | ORAL | 0 refills | Status: DC

## 2022-04-22 ENCOUNTER — Encounter: Payer: Self-pay | Admitting: Physician Assistant

## 2022-04-22 ENCOUNTER — Ambulatory Visit: Payer: BC Managed Care – PPO | Admitting: Physician Assistant

## 2022-04-22 ENCOUNTER — Other Ambulatory Visit: Payer: Self-pay | Admitting: Physician Assistant

## 2022-04-22 VITALS — BP 116/78 | HR 72 | Temp 97.3°F | Ht 74.0 in | Wt 189.2 lb

## 2022-04-22 DIAGNOSIS — J40 Bronchitis, not specified as acute or chronic: Secondary | ICD-10-CM

## 2022-04-22 DIAGNOSIS — J069 Acute upper respiratory infection, unspecified: Secondary | ICD-10-CM | POA: Diagnosis not present

## 2022-04-22 LAB — POC COVID19 BINAXNOW: SARS Coronavirus 2 Ag: NEGATIVE

## 2022-04-22 MED ORDER — PREDNISONE 20 MG PO TABS: ORAL_TABLET | ORAL | 0 refills | Status: AC

## 2022-04-22 MED ORDER — TRIAMCINOLONE ACETONIDE 40 MG/ML IJ SUSP: 60.0000 mg | Freq: Once | INTRAMUSCULAR | Status: DC

## 2022-04-22 MED ORDER — AMOXICILLIN-POT CLAVULANATE 875-125 MG PO TABS: 1.0000 | ORAL_TABLET | Freq: Two times a day (BID) | ORAL | 0 refills | Status: DC

## 2022-04-22 MED ORDER — ALBUTEROL SULFATE HFA 108 (90 BASE) MCG/ACT IN AERS: 2.0000 | INHALATION_SPRAY | Freq: Four times a day (QID) | RESPIRATORY_TRACT | 2 refills | Status: DC | PRN

## 2022-04-22 MED ORDER — AZITHROMYCIN 250 MG PO TABS: ORAL_TABLET | ORAL | 0 refills | Status: AC

## 2022-04-22 NOTE — Progress Notes (Signed)
Acute Office Visit  Subjective:    Patient ID: Brad Whidby., male    DOB: [birth year], 51 y.o.   MRN: [id number]  Chief Complaint  Patient presents with   Cough   Nasal Congestion    HPI: Patient is in today for complaints of cough, cold and congestion for the past several days - he has been taking otc meds and also had a zpack --- cough has worsened and having coughing spells.  Occasional wheezing Denies fever - has had some malaise  Past Medical History:  Diagnosis Date   GERD (gastroesophageal reflux disease)     History reviewed. No pertinent surgical history.  Family History  Problem Relation Age of Onset   Heart attack Father    Diabetes Father    Hypertension Father    Deep vein thrombosis Father    Breast cancer Sister    Heart defect Sister    Thyroid disease Sister    Lung cancer Maternal Grandmother    Lung cancer Maternal Grandfather    Heart attack Paternal Grandmother    Lung cancer Paternal Grandfather     Social History   Socioeconomic History   Marital status: Married    Spouse name: Not on file   Number of children: 2   Years of education: Not on file   Highest education level: Associate degree: occupational, Hotel manager, or vocational program  Occupational History   Occupation: Automotive engineer: Orthoptist  Tobacco Use   Smoking status: Never   Smokeless tobacco: Current    Types: Snuff  Vaping Use   Vaping Use: Never used  Substance and Sexual Activity   Alcohol use: Not Currently   Drug use: Never   Sexual activity: Not on file  Other Topics Concern   Not on file  Social History Narrative   Patient is right-handed. He lives with his wife and one of his daughters in a 2 story house. He rarely drinks caffeine now, ghe is very active at work.   Social Determinants of Health   Financial Resource Strain: Not on file  Food Insecurity: Not on file  Transportation Needs: Not on file  Physical Activity:  Not on file  Stress: Not on file  Social Connections: Not on file  Intimate Partner Violence: Not on file    Outpatient Medications Prior to Visit  Medication Sig Dispense Refill   esomeprazole (NEXIUM) 40 MG capsule Take 40 mg by mouth daily at 12 noon.     fluticasone (FLONASE) 50 MCG/ACT nasal spray SPRAY 2 SPRAYS INTO EACH NOSTRIL EVERY DAY 48 mL 2   hydrocortisone (ANUSOL-HC) 25 MG suppository Place 1 suppository (25 mg total) rectally 2 (two) times daily. 12 suppository 1   azithromycin (ZITHROMAX) 250 MG tablet Take 2 tablets on day 1, then 1 tablet daily on days 2 through 5 6 tablet 0   No facility-administered medications prior to visit.    No Known Allergies  Review of Systems CONSTITUTIONAL: Negative for chills, fatigue, fever,  E/N/T:see HPI CARDIOVASCULAR: Negative for chest pain, dizziness, palpitations and pedal edema.  RESPIRATORY: see HPI  INTEGUMENTARY: Negative for rash.          Objective:  PHYSICAL EXAM:   VS: BP 116/78 (BP Location: Left Arm, Patient Position: Sitting, Cuff Size: Large)   Pulse 72   Temp (!) 97.3 F (36.3 C) (Temporal)   Ht 6' 2"  (1.88 m)   Wt 189 lb 3.2 oz (85.8 kg)  SpO2 98%   BMI 24.29 kg/m   GEN: Well nourished, well developed, in no acute distress  HEENT: normal external ears and nose - normal external auditory canals and TMS -  - Lips, Teeth and Gums - normal  Oropharynx - mild erythema Cardiac: RRR; no murmurs,  Respiratory:  faint wheezes scattered - clears with coughing Skin: warm and dry, no rash   Office Visit on 04/22/2022  Component Date Value Ref Range Status   SARS Coronavirus 2 Ag 04/22/2022 Negative  Negative Final    There are no preventive care reminders to display for this patient.  There are no preventive care reminders to display for this patient.   Lab Results  Component Value Date   TSH 1.620 03/03/2022   Lab Results  Component Value Date   WBC 3.9 03/03/2022   HGB 14.0 03/03/2022   HCT  42.2 03/03/2022   MCV 89 03/03/2022   PLT 276 03/03/2022   Lab Results  Component Value Date   NA 139 03/03/2022   K 3.9 03/03/2022   CO2 23 03/03/2022   GLUCOSE 107 (H) 03/03/2022   BUN 10 03/03/2022   CREATININE 1.06 03/03/2022   BILITOT 0.6 03/03/2022   ALKPHOS 72 03/03/2022   AST 16 03/03/2022   ALT 16 03/03/2022   PROT 6.9 03/03/2022   ALBUMIN 4.6 03/03/2022   CALCIUM 9.8 03/03/2022   ANIONGAP 7 12/20/2017   EGFR 85 03/03/2022   Lab Results  Component Value Date   CHOL 169 08/15/2020   Lab Results  Component Value Date   HDL 39 (L) 08/15/2020   Lab Results  Component Value Date   LDLCALC 116 (H) 08/15/2020   Lab Results  Component Value Date   TRIG 74 08/15/2020   Lab Results  Component Value Date   CHOLHDL 4.3 08/15/2020   Lab Results  Component Value Date   HGBA1C 5.9 (H) 02/18/2021       Assessment & Plan:   Problem List Items Addressed This Visit   None Visit Diagnoses     Acute upper respiratory infection    -  Primary   Relevant Medications   azithromycin (ZITHROMAX) 250 MG tablet   Other Relevant Orders   POC COVID-19 (Completed)   Bronchitis       Relevant Medications   triamcinolone acetonide (KENALOG-40) injection 60 mg (Start on 04/22/2022 11:00 AM)   azithromycin (ZITHROMAX) 250 MG tablet   albuterol (VENTOLIN HFA) 108 (90 Base) MCG/ACT inhaler   predniSONE (DELTASONE) 20 MG tablet      Meds ordered this encounter  Medications   triamcinolone acetonide (KENALOG-40) injection 60 mg   azithromycin (ZITHROMAX) 250 MG tablet    Sig: Take 2 tablets on day 1, then 1 tablet daily on days 2 through 5    Dispense:  6 tablet    Refill:  0    Order Specific Question:   Supervising Provider    Answer:   COX, Elnita Maxwell [[id number]]   albuterol (VENTOLIN HFA) 108 (90 Base) MCG/ACT inhaler    Sig: Inhale 2 puffs into the lungs every 6 (six) hours as needed for wheezing or shortness of breath.    Dispense:  8 g    Refill:  2    Order  Specific Question:   Supervising Provider    Answer:   Shelton Silvas   predniSONE (DELTASONE) 20 MG tablet    Sig: Take 3 tablets (60 mg total) by mouth daily with breakfast for 3 days,  THEN 2 tablets (40 mg total) daily with breakfast for 3 days, THEN 1 tablet (20 mg total) daily with breakfast for 3 days.    Dispense:  18 tablet    Refill:  0    Order Specific Question:   Supervising Provider    AnswerShelton Silvas    Orders Placed This Encounter  Procedures   POC COVID-19     Follow-up: Return if symptoms worsen or fail to improve.  An After Visit Summary was printed and given to the patient.  Yetta Flock [facility] [phone]

## 2022-05-07 ENCOUNTER — Ambulatory Visit (INDEPENDENT_AMBULATORY_CARE_PROVIDER_SITE_OTHER): Payer: Worker's Compensation | Admitting: Orthopaedic Surgery

## 2022-05-07 DIAGNOSIS — M5126 Other intervertebral disc displacement, lumbar region: Secondary | ICD-10-CM | POA: Diagnosis not present

## 2022-05-07 NOTE — Progress Notes (Signed)
Office Visit Note   Patient: Brad Zuniga.           Date of Birth: 01-13-1971           MRN: [id number] Visit Date: 05/07/2022              Requested by: Marianne Sofia, PA-C [address] [place],  [place] [address] PCP: Marianne Sofia, PA-C   Assessment & Plan: Visit Diagnoses:  1. Protrusion of lumbar intervertebral disc     Plan: Reviewed MRI scan which showed some paracentral disc protrusion L5-S1 with annular fissure.  Annular bulge midline L4-5 slightly eccentric to the right.  At L5-S1 a slightly more left-sided than right.  We will check him back in 2 months once his knee is better hopefully will be limping less more mobile and if his back symptoms have improved then we can defer reimaging.  He is having persistent symptoms with his back on follow-up did not recommend repeat imaging with his persistent problems with leg numbness leg pain pain with sitting and pain with bending turning twisting.  Follow-Up Instructions: Return in about 2 months (around 07/07/2022).   Orders:  No orders of the defined types were placed in this encounter.  No orders of the defined types were placed in this encounter.     Procedures: No procedures performed   Clinical Data: No additional findings.   Subjective: Chief Complaint  Patient presents with   Lower Back - Follow-up    HPI 51 year old male returns for follow-up with ongoing low back pain.  He states his legs still go numb he had the epidural injection on 01/27/2022 at L5-S1 paramedian.  Patient states he still has pain when he bends forward trying to brush his teeth.  He thinks he is improved since he was first seen back in April.  If he sits too long and stand too long he has some aching pain.  He states he tries to roll over his 1 unit in bed which makes his back bother him less.  He has knee surgery scheduled for right knee for meniscal tear and possible ACL reconstruction in 2 weeks in [place].  Review of Systems all  systems updated noncontributory to HPI.   Objective: Vital Signs: BP 137/88   Pulse 71   Ht 6\' 2"  (1.88 m)   Wt 185 lb (83.9 kg)   BMI 23.75 kg/m   Physical Exam Constitutional:      Appearance: He is well-developed.  HENT:     Head: Normocephalic and atraumatic.     Right Ear: External ear normal.     Left Ear: External ear normal.  Eyes:     Pupils: Pupils are equal, round, and reactive to light.  Neck:     Thyroid: No thyromegaly.     Trachea: No tracheal deviation.  Cardiovascular:     Rate and Rhythm: Normal rate.  Pulmonary:     Effort: Pulmonary effort is normal.     Breath sounds: No wheezing.  Abdominal:     General: Bowel sounds are normal.     Palpations: Abdomen is soft.  Musculoskeletal:     Cervical back: Neck supple.  Skin:    General: Skin is warm and dry.     Capillary Refill: Capillary refill takes less than 2 seconds.  Neurological:     Mental Status: He is alert and oriented to person, place, and time.  Psychiatric:        Behavior:  Behavior normal.        Thought Content: Thought content normal.        Judgment: Judgment normal.     Ortho Exam patient is amatory with a right knee limp using knee brace with patella hole.  He has somewhat pronounced limp minimal knee flexion.  He gets from sitting standing anterior tib gastrocsoleus is strong.  Specialty Comments:  MRI LUMBAR SPINE WITHOUT CONTRAST   TECHNIQUE: Multiplanar, multisequence MR imaging of the lumbar spine was performed. No intravenous contrast was administered.   COMPARISON:  X-ray 03/26/2021   FINDINGS: Segmentation: Based on comparison images including chest x-ray from 07/25/2020 and CT abdomen pelvis from 09/14/2019, there are 12 rib-bearing thoracic type vertebral segments and 5 lumbar type vertebral segments with a near fully lumbarized transitional S1 segment and a well developed disc space at the S1-S2 level.   Alignment:  Physiologic.   Vertebrae:  No fracture,  evidence of discitis, or bone lesion.   Conus medullaris and cauda equina: Conus extends to the L1 level. Conus and cauda equina appear normal.   Paraspinal and other soft tissues: Negative.   Disc levels:   T12-L1: No significant disc protrusion, foraminal stenosis, or canal stenosis.   L1-L2: No significant disc protrusion, foraminal stenosis, or canal stenosis.   L2-L3: No significant disc protrusion, foraminal stenosis, or canal stenosis.   L3-L4: No significant disc protrusion, foraminal stenosis, or canal stenosis.   L4-L5: Mild annular disc bulge, slightly eccentric to the right resulting in mild right foraminal stenosis. No canal stenosis.   L5-S1: Left paracentral disc protrusion with annular fissure. Minimal facet arthropathy. There is moderate left and mild right subarticular recess stenosis without canal stenosis. No significant foraminal stenosis.   S1-S2: Shallow central disc protrusion without canal or foraminal stenosis.   IMPRESSION: 1. 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 2. Left paracentral disc protrusion at L5-S1 with annular fissure resulting in moderate left and mild right subarticular recess stenosis without canal stenosis. 3. Mild right foraminal stenosis at L4-L5. 4. No canal stenosis at any level.     Electronically Signed   By: Duanne Guess D.O.   On: 04/06/2021 16:51  Imaging: No results found.   PMFS History: Patient Active Problem List   Diagnosis Date Noted   Protrusion of lumbar intervertebral disc 05/07/2022   Lymphadenitis 04/15/2021   Temporal lobe epilepsy (HCC) 08/20/2020   GERD (gastroesophageal reflux disease)    Dyspnea 07/25/2020   Chest pain 07/25/2020   Cough 07/24/2020   Other fatigue 07/24/2020   Rectal bleeding 09/15/2019   Generalized abdominal pain 09/15/2019   Acute laryngopharyngitis 09/15/2019   Cerebral aneurysm without rupture  01/12/2018   Chronic daily headache 01/12/2018   Postural dizziness with presyncope 01/12/2018   RMSF [facility] spotted fever) 01/12/2018   Past Medical History:  Diagnosis Date   GERD (gastroesophageal reflux disease)     Family History  Problem Relation Age of Onset   Heart attack Father    Diabetes Father    Hypertension Father    Deep vein thrombosis Father    Breast cancer Sister    Heart defect Sister    Thyroid disease Sister    Lung cancer Maternal Grandmother    Lung cancer Maternal Grandfather    Heart attack Paternal Grandmother    Lung cancer Paternal Grandfather     No past surgical history on file. Social History   Occupational  History   Occupation: Automotive engineer: Orthoptist  Tobacco Use   Smoking status: Never   Smokeless tobacco: Current    Types: Snuff  Vaping Use   Vaping Use: Never used  Substance and Sexual Activity   Alcohol use: Not Currently   Drug use: Never   Sexual activity: Not on file

## 2022-07-04 ENCOUNTER — Encounter: Payer: Self-pay | Admitting: Orthopaedic Surgery

## 2022-07-04 ENCOUNTER — Ambulatory Visit (INDEPENDENT_AMBULATORY_CARE_PROVIDER_SITE_OTHER): Payer: Worker's Compensation | Admitting: Orthopaedic Surgery

## 2022-07-04 VITALS — BP 138/86 | HR 81 | Ht 74.0 in | Wt 185.0 lb

## 2022-07-04 DIAGNOSIS — M5126 Other intervertebral disc displacement, lumbar region: Secondary | ICD-10-CM

## 2022-07-04 NOTE — Addendum Note (Signed)
Addended by: Rogers Seeds on: 07/04/2022 02:08 PM   Modules accepted: Orders

## 2022-07-04 NOTE — Progress Notes (Signed)
Office Visit Note   Patient: Brad Zuniga.           Date of Birth: 01-30-1971           MRN: [id number] Visit Date: 07/04/2022              Requested by: Marianne Sofia, PA-C [address] [place],  [place] [address] PCP: Marianne Sofia, PA-C   Assessment & Plan: Visit Diagnoses:  1. Protrusion of lumbar intervertebral disc     Plan: Patient is persistently symptomatic he got some relief from the epidural.  I recommend reimaging studies at this point and if the scan looks better than therapy can be incorporated for his back at same time he is working on the knee therapy.  The MRI scan shows worse nerve compression then her options would be repeat epidural versus surgical intervention.  Office follow-up after MRI we will seek approval for repeat imaging study study [place] imaging for comparison to his 04/06/2021 lumbar MRI.  Follow-Up Instructions: No follow-ups on file.   Orders:  No orders of the defined types were placed in this encounter.  No orders of the defined types were placed in this encounter.     Procedures: No procedures performed   Clinical Data: No additional findings.   Subjective: Chief Complaint  Patient presents with   Lower Back - Pain, Follow-up    HPI 51 year old male with turning follow-up on-the-job injury 03/26/2021.  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  MRI scan 04/06/2021 showed left paracentral disc protrusion with some facet arthropathy.  Mild foraminal narrowing at L4-5.  He has had some knee arthroscopy right knee is in therapy has his knee taped.  He felt within the last 2 weeks is using a cane as his right knee taped and is still in therapy for his knee.  Epidural that I ordered for his back gave him that he thinks around 40% improvement and then gradually is wearing off.  Patient remains out of work since his injury now greater than 1 year. He states both legs  are numb at times and sometimes he feels like electricity shooting down to his feet. Review of Systems review of systems updated unchanged from 10/29/2021 office visit other than as mentioned above.   Objective: Vital Signs: BP 138/86   Pulse 81   Ht 6\' 2"  (1.88 m)   Wt 185 lb (83.9 kg)   BMI 23.75 kg/m   Physical Exam Constitutional:      Appearance: He is well-developed.  HENT:     Head: Normocephalic and atraumatic.     Right Ear: External ear normal.     Left Ear: External ear normal.  Eyes:     Pupils: Pupils are equal, round, and reactive to light.  Neck:     Thyroid: No thyromegaly.     Trachea: No tracheal deviation.  Cardiovascular:     Rate and Rhythm: Normal rate.  Pulmonary:     Effort: Pulmonary effort is normal.     Breath sounds: No wheezing.  Abdominal:     General: Bowel sounds are normal.     Palpations: Abdomen is soft.  Musculoskeletal:     Cervical back: Neck supple.  Skin:    General: Skin is warm and dry.     Capillary Refill: Capillary refill takes less than 2 seconds.  Neurological:     Mental Status:  He is alert and oriented to person, place, and time.  Psychiatric:        Behavior: Behavior normal.        Thought Content: Thought content normal.        Judgment: Judgment normal.     Ortho Exam patient is stable and knees amatory with a cane but can walk without a cane he has some quad atrophy on the right.  Some pain with straight leg raising 90 degrees on the left.  Specialty Comments:  MRI LUMBAR SPINE WITHOUT CONTRAST   TECHNIQUE: Multiplanar, multisequence MR imaging of the lumbar spine was performed. No intravenous contrast was administered.   COMPARISON:  X-ray 03/26/2021   FINDINGS: Segmentation: Based on comparison images including chest x-ray from 07/25/2020 and CT abdomen pelvis from 09/14/2019, there are 12 rib-bearing thoracic type vertebral segments and 5 lumbar type vertebral segments with a near fully lumbarized  transitional S1 segment and a well developed disc space at the S1-S2 level.   Alignment:  Physiologic.   Vertebrae:  No fracture, evidence of discitis, or bone lesion.   Conus medullaris and cauda equina: Conus extends to the L1 level. Conus and cauda equina appear normal.   Paraspinal and other soft tissues: Negative.   Disc levels:   T12-L1: No significant disc protrusion, foraminal stenosis, or canal stenosis.   L1-L2: No significant disc protrusion, foraminal stenosis, or canal stenosis.   L2-L3: No significant disc protrusion, foraminal stenosis, or canal stenosis.   L3-L4: No significant disc protrusion, foraminal stenosis, or canal stenosis.   L4-L5: Mild annular disc bulge, slightly eccentric to the right resulting in mild right foraminal stenosis. No canal stenosis.   L5-S1: Left paracentral disc protrusion with annular fissure. Minimal facet arthropathy. There is moderate left and mild right subarticular recess stenosis without canal stenosis. No significant foraminal stenosis.   S1-S2: Shallow central disc protrusion without canal or foraminal stenosis.   IMPRESSION: 1. 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 2. Left paracentral disc protrusion at L5-S1 with annular fissure resulting in moderate left and mild right subarticular recess stenosis without canal stenosis. 3. Mild right foraminal stenosis at L4-L5. 4. No canal stenosis at any level.     Electronically Signed   By: Duanne Guess D.O.   On: 04/06/2021 16:51  Imaging: No results found.   PMFS History: Patient Active Problem List   Diagnosis Date Noted   Protrusion of lumbar intervertebral disc 05/07/2022   Lymphadenitis 04/15/2021   Temporal lobe epilepsy (HCC) 08/20/2020   GERD (gastroesophageal reflux disease)    Dyspnea 07/25/2020   Chest pain 07/25/2020   Cough 07/24/2020   Other fatigue 07/24/2020   Rectal  bleeding 09/15/2019   Generalized abdominal pain 09/15/2019   Acute laryngopharyngitis 09/15/2019   Cerebral aneurysm without rupture 01/12/2018   Chronic daily headache 01/12/2018   Postural dizziness with presyncope 01/12/2018   RMSF [facility] spotted fever) 01/12/2018   Past Medical History:  Diagnosis Date   GERD (gastroesophageal reflux disease)     Family History  Problem Relation Age of Onset   Heart attack Father    Diabetes Father    Hypertension Father    Deep vein thrombosis Father    Breast cancer Sister    Heart defect Sister    Thyroid disease Sister    Lung cancer Maternal Grandmother    Lung cancer Maternal Grandfather    Heart attack Paternal Grandmother  Lung cancer Paternal Grandfather     No past surgical history on file. 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

## 2022-07-22 ENCOUNTER — Other Ambulatory Visit: Payer: Self-pay | Admitting: Orthopaedic Surgery

## 2022-07-22 DIAGNOSIS — M5126 Other intervertebral disc displacement, lumbar region: Secondary | ICD-10-CM

## 2022-07-24 ENCOUNTER — Telehealth: Payer: Self-pay | Admitting: Radiology

## 2022-07-24 NOTE — Telephone Encounter (Signed)
Please see message from [employer] below and advise. I explained we have not taken patient out of work by work notes and that he has been out prior to coming to see you. They are requesting work note. OK for out of work until follow up in the office to review MRI?

## 2022-07-28 NOTE — Telephone Encounter (Signed)
Holding for betsy

## 2022-08-05 ENCOUNTER — Ambulatory Visit
Admission: RE | Admit: 2022-08-05 | Discharge: 2022-08-05 | Disposition: A | Discharge status: Home / Self Care | Payer: Worker's Compensation | Source: Ambulatory Visit | Attending: Orthopaedic Surgery | Admitting: Orthopaedic Surgery

## 2022-08-05 DIAGNOSIS — M5126 Other intervertebral disc displacement, lumbar region: Secondary | ICD-10-CM

## 2022-08-06 ENCOUNTER — Ambulatory Visit (INDEPENDENT_AMBULATORY_CARE_PROVIDER_SITE_OTHER): Payer: Worker's Compensation | Admitting: Orthopaedic Surgery

## 2022-08-06 ENCOUNTER — Encounter: Payer: Self-pay | Admitting: Orthopaedic Surgery

## 2022-08-06 VITALS — BP 143/90 | HR 71 | Ht 74.0 in | Wt 185.0 lb

## 2022-08-06 DIAGNOSIS — M5126 Other intervertebral disc displacement, lumbar region: Secondary | ICD-10-CM | POA: Diagnosis not present

## 2022-08-06 NOTE — Progress Notes (Signed)
Office Visit Note   Patient: Brad Zuniga.           Date of Birth: [birth year]           MRN: [id number] Visit Date: 08/06/2022              Requested by: Marge Duncans, PA-C [address] [place],  [place] [address] PCP: Marge Duncans, PA-C   Assessment & Plan: Visit Diagnoses:  1. Protrusion of lumbar intervertebral disc     Plan: Lumbar spine is stable and at this point I would recommend nonoperative intervention for his stable left paracentral annular fissure with a stable disc protrusion.  We discussed microdiscectomy.  He is seeing another doctor about his knee who has had  him out of work.  If he decides he like to proceed with operative intervention he can let us know otherwise once his knee is doing better and is released for work then I am comfortable with him returning to work from the standpoint of his lumbar spine.  If he gets increasing radicular symptoms we can reconsider treatment options.  Follow-Up Instructions: No follow-ups on file.   Orders:  No orders of the defined types were placed in this encounter.  No orders of the defined types were placed in this encounter.     Procedures: No procedures performed   Clinical Data: No additional findings.   Subjective: Chief Complaint  Patient presents with   Lower Back - Pain, Follow-up    MRI review [facility] 03/26/2021    HPI 52 year old male returns for follow-up on-the-job injury from 03/26/2021 states he still having problems with his lower back and is using Tylenol and ibuprofen.  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  He did get some relief from epidural.  New MRI was obtained and done on 08/05/2022 just read today which shows L5-S1 left paracentral annular fissure with disc protrusion affecting the descending left S1 nerve root consistent with his symptoms.  These changes appear stable from his 2022 imaging studies.  Patient has been out of work for another reason.  He did get  some improvement with the previous epidural.  Review of Systems no chills or fever all other systems updated unchanged.   Objective: Vital Signs: BP (!) 143/90   Pulse 71   Ht 6\' 2"  (1.88 m)   Wt 185 lb (83.9 kg)   BMI 23.75 kg/m   Physical Exam Constitutional:      Appearance: He is well-developed.  HENT:     Head: Normocephalic and atraumatic.     Right Ear: External ear normal.     Left Ear: External ear normal.  Eyes:     Pupils: Pupils are equal, round, and reactive to light.  Neck:     Thyroid: No thyromegaly.     Trachea: No tracheal deviation.  Cardiovascular:     Rate and Rhythm: Normal rate.  Pulmonary:     Effort: Pulmonary effort is normal.     Breath sounds: No wheezing.  Abdominal:     General: Bowel sounds are normal.     Palpations: Abdomen is soft.  Musculoskeletal:     Cervical back: Neck supple.  Skin:    General: Skin is warm and dry.     Capillary Refill: Capillary refill takes less than 2 seconds.  Neurological:     Mental Status: He is alert and oriented to person, place, and time.  Psychiatric:  Behavior: Behavior normal.        Thought Content: Thought content normal.        Judgment: Judgment normal.     Ortho Exam patient is ambulating with and without a cane he has some quad atrophy on the right.  Some pain with straight leg raising 90 degrees on the left.  Negative logroll the hips.  Specialty Comments:  MRI LUMBAR SPINE WITHOUT CONTRAST   TECHNIQUE: Multiplanar, multisequence MR imaging of the lumbar spine was performed. No intravenous contrast was administered.   COMPARISON:  X-ray 03/26/2021   FINDINGS: Segmentation: Based on comparison images including chest x-ray from 07/25/2020 and CT abdomen pelvis from 09/14/2019, there are 12 rib-bearing thoracic type vertebral segments and 5 lumbar type vertebral segments with a near fully lumbarized transitional S1 segment and a well developed disc space at the S1-S2 level.    Alignment:  Physiologic.   Vertebrae:  No fracture, evidence of discitis, or bone lesion.   Conus medullaris and cauda equina: Conus extends to the L1 level. Conus and cauda equina appear normal.   Paraspinal and other soft tissues: Negative.   Disc levels:   T12-L1: No significant disc protrusion, foraminal stenosis, or canal stenosis.   L1-L2: No significant disc protrusion, foraminal stenosis, or canal stenosis.   L2-L3: No significant disc protrusion, foraminal stenosis, or canal stenosis.   L3-L4: No significant disc protrusion, foraminal stenosis, or canal stenosis.   L4-L5: Mild annular disc bulge, slightly eccentric to the right resulting in mild right foraminal stenosis. No canal stenosis.   L5-S1: Left paracentral disc protrusion with annular fissure. Minimal facet arthropathy. There is moderate left and mild right subarticular recess stenosis without canal stenosis. No significant foraminal stenosis.   S1-S2: Shallow central disc protrusion without canal or foraminal stenosis.   IMPRESSION: 1. 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 2. Left paracentral disc protrusion at L5-S1 with annular fissure resulting in moderate left and mild right subarticular recess stenosis without canal stenosis. 3. Mild right foraminal stenosis at L4-L5. 4. No canal stenosis at any level.     Electronically Signed   By: Davina Poke D.O.   On: 04/06/2021 16:51  Imaging: No results found.   PMFS History: Patient Active Problem List   Diagnosis Date Noted   Protrusion of lumbar intervertebral disc 05/07/2022   Lymphadenitis 04/15/2021   Temporal lobe epilepsy ([place]) 08/20/2020   GERD (gastroesophageal reflux disease)    Dyspnea 07/25/2020   Chest pain 07/25/2020   Cough 07/24/2020   Other fatigue 07/24/2020   Rectal bleeding 09/15/2019   Generalized abdominal pain 09/15/2019   Acute  laryngopharyngitis 09/15/2019   Cerebral aneurysm without rupture 01/12/2018   Chronic daily headache 01/12/2018   Postural dizziness with presyncope 01/12/2018   RMSF [facility] spotted fever) 01/12/2018   Past Medical History:  Diagnosis Date   GERD (gastroesophageal reflux disease)     Family History  Problem Relation Age of Onset   Heart attack Father    Diabetes Father    Hypertension Father    Deep vein thrombosis Father    Breast cancer Sister    Heart defect Sister    Thyroid disease Sister    Lung cancer Maternal Grandmother    Lung cancer Maternal Grandfather    Heart attack Paternal Grandmother    Lung cancer Paternal Grandfather     No past surgical history on file. Social History   Occupational  History   Occupation: Automotive engineer: Orthoptist  Tobacco Use   Smoking status: Never   Smokeless tobacco: Current    Types: Snuff  Vaping Use   Vaping Use: Never used  Substance and Sexual Activity   Alcohol use: Not Currently   Drug use: Never   Sexual activity: Not on file

## 2022-08-13 ENCOUNTER — Telehealth: Payer: Self-pay | Admitting: Radiology

## 2022-08-13 NOTE — Telephone Encounter (Signed)
Please see message from Dr. Lorin Mercy.  Updated office note available to send. I have also entered work note per Dr. Lorin Mercy instructions. Thanks.

## 2022-08-13 NOTE — Telephone Encounter (Signed)
Please see message from [employer] below and advise on work note.   WC is needing a work note from office visit with Dr. Lorin Mercy from 08-06-22.  Can you provide this?

## 2022-09-18 ENCOUNTER — Ambulatory Visit (INDEPENDENT_AMBULATORY_CARE_PROVIDER_SITE_OTHER): Payer: BC Managed Care – PPO | Admitting: Physician Assistant

## 2022-09-18 ENCOUNTER — Encounter: Payer: Self-pay | Admitting: Physician Assistant

## 2022-09-18 VITALS — BP 104/80 | HR 86 | Temp 98.7°F | Resp 12 | Ht 74.0 in | Wt 198.0 lb

## 2022-09-18 DIAGNOSIS — J101 Influenza due to other identified influenza virus with other respiratory manifestations: Secondary | ICD-10-CM | POA: Diagnosis not present

## 2022-09-18 DIAGNOSIS — J9801 Acute bronchospasm: Secondary | ICD-10-CM | POA: Diagnosis not present

## 2022-09-18 LAB — POC COVID19 BINAXNOW: SARS Coronavirus 2 Ag: NEGATIVE

## 2022-09-18 LAB — POCT INFLUENZA A/B
Influenza A, POC: NEGATIVE
Influenza B, POC: POSITIVE — AB

## 2022-09-18 MED ORDER — FLUTICASONE PROPIONATE 50 MCG/ACT NA SUSP: 2.0000 | Freq: Every day | NASAL | 6 refills | Status: AC

## 2022-09-18 MED ORDER — ALBUTEROL SULFATE HFA 108 (90 BASE) MCG/ACT IN AERS: 2.0000 | INHALATION_SPRAY | Freq: Four times a day (QID) | RESPIRATORY_TRACT | 0 refills | Status: AC | PRN

## 2022-09-18 MED ORDER — PREDNISONE 20 MG PO TABS: ORAL_TABLET | ORAL | 0 refills | Status: AC

## 2022-09-18 MED ORDER — OSELTAMIVIR PHOSPHATE 75 MG PO CAPS: 75.0000 mg | ORAL_CAPSULE | Freq: Two times a day (BID) | ORAL | 0 refills | Status: AC

## 2022-09-18 NOTE — Progress Notes (Signed)
Acute Office Visit  Subjective:    Patient ID: Brad Zuniga., male    DOB: 1970/11/06, 52 y.o.   MRN: [id number]  Chief Complaint  Patient presents with   Cough   Headache   Nasal Congestion    HPI: Patient is in today for complaints of cough, congestion, malaise and achiness for the past few days - family with similar symptoms He has been having some trouble with wheezing Requests refill of flonase   Past Medical History:  Diagnosis Date   GERD (gastroesophageal reflux disease)     History reviewed. No pertinent surgical history.  Family History  Problem Relation Age of Onset   Heart attack Father    Diabetes Father    Hypertension Father    Deep vein thrombosis Father    Breast cancer Sister    Heart defect Sister    Thyroid disease Sister    Lung cancer Maternal Grandmother    Lung cancer Maternal Grandfather    Heart attack Paternal Grandmother    Lung cancer Paternal Grandfather     Social History   Socioeconomic History   Marital status: Married    Spouse name: Not on file   Number of children: 2   Years of education: Not on file   Highest education level: Associate degree: occupational, Hotel manager, or vocational program  Occupational History   Occupation: Automotive engineer: Orthoptist  Tobacco Use   Smoking status: Never   Smokeless tobacco: Current    Types: Snuff  Vaping Use   Vaping Use: Never used  Substance and Sexual Activity   Alcohol use: Not Currently   Drug use: Never   Sexual activity: Yes  Other Topics Concern   Not on file  Social History Narrative   Patient is right-handed. He lives with his wife and one of his daughters in a 2 story house. He rarely drinks caffeine now, ghe is very active at work.   Social Determinants of Health   Financial Resource Strain: Not on file  Food Insecurity: Not on file  Transportation Needs: Not on file  Physical Activity: Not on file  Stress: Not on file  Social  Connections: Not on file  Intimate Partner Violence: Not on file    Outpatient Medications Prior to Visit  Medication Sig Dispense Refill   esomeprazole (NEXIUM) 40 MG capsule Take 40 mg by mouth daily at 12 noon.     albuterol (VENTOLIN HFA) 108 (90 Base) MCG/ACT inhaler Inhale 2 puffs into the lungs every 6 (six) hours as needed for wheezing or shortness of breath. 8 g 2   amoxicillin-clavulanate (AUGMENTIN) 875-125 MG tablet Take 1 tablet by mouth 2 (two) times daily. 20 tablet 0   fluticasone (FLONASE) 50 MCG/ACT nasal spray SPRAY 2 SPRAYS INTO EACH NOSTRIL EVERY DAY 48 mL 2   hydrocortisone (ANUSOL-HC) 25 MG suppository Place 1 suppository (25 mg total) rectally 2 (two) times daily. 12 suppository 1   triamcinolone acetonide (KENALOG-40) injection 60 mg      No facility-administered medications prior to visit.    No Known Allergies  Review of Systems CONSTITUTIONAL: see HPI E/N/T: see HPI CARDIOVASCULAR: Negative for chest pain, RESPIRATORY: see HPI         Objective:   PHYSICAL EXAM:   VS: BP 104/80   Pulse 86   Temp 98.7 F (37.1 C)   Resp 12   Ht '6\' 2"'$  (1.88 m)   Wt 198 lb (89.8 kg)  SpO2 98%   BMI 25.42 kg/m   GEN: Well nourished, well developed, in no acute distress  HEENT: normal external ears and nose - normal external auditory canals and TMS -  - Lips, Teeth and Gums - normal  Oropharynx -mild erythema/pnd Cardiac: RRR; no murmurs, rubs,  Respiratory:  scattered wheezes noted but clears with cough Skin: warm and dry, no rash    Office Visit on 09/18/2022  Component Date Value Ref Range Status   SARS Coronavirus 2 Ag 09/18/2022 Negative  Negative Final   Influenza A, POC 09/18/2022 Negative  Negative Final   Influenza B, POC 09/18/2022 Positive (A)  Negative Final       Lab Results  Component Value Date   TSH 1.620 03/03/2022   Lab Results  Component Value Date   WBC 3.9 03/03/2022   HGB 14.0 03/03/2022   HCT 42.2 03/03/2022   MCV 89  03/03/2022   PLT 276 03/03/2022   Lab Results  Component Value Date   NA 139 03/03/2022   K 3.9 03/03/2022   CO2 23 03/03/2022   GLUCOSE 107 (H) 03/03/2022   BUN 10 03/03/2022   CREATININE 1.06 03/03/2022   BILITOT 0.6 03/03/2022   ALKPHOS 72 03/03/2022   AST 16 03/03/2022   ALT 16 03/03/2022   PROT 6.9 03/03/2022   ALBUMIN 4.6 03/03/2022   CALCIUM 9.8 03/03/2022   ANIONGAP 7 12/20/2017   EGFR 85 03/03/2022   Lab Results  Component Value Date   CHOL 169 08/15/2020   Lab Results  Component Value Date   HDL 39 (L) 08/15/2020   Lab Results  Component Value Date   LDLCALC 116 (H) 08/15/2020   Lab Results  Component Value Date   TRIG 74 08/15/2020   Lab Results  Component Value Date   CHOLHDL 4.3 08/15/2020   Lab Results  Component Value Date   HGBA1C 5.9 (H) 02/18/2021       Assessment & Plan:  Influenza B -     POC COVID-19 BinaxNow -     POCT Influenza A/B -     Oseltamivir Phosphate; Take 1 capsule (75 mg total) by mouth 2 (two) times daily.  Dispense: 10 capsule; Refill: 0  Bronchospasm -     Albuterol Sulfate HFA; Inhale 2 puffs into the lungs every 6 (six) hours as needed for wheezing or shortness of breath.  Dispense: 8 g; Refill: 0 -     predniSONE; Take 3 tablets (60 mg total) by mouth daily with breakfast for 3 days, THEN 2 tablets (40 mg total) daily with breakfast for 3 days, THEN 1 tablet (20 mg total) daily with breakfast for 3 days.  Dispense: 18 tablet; Refill: 0  Other orders -     Fluticasone Propionate; Place 2 sprays into both nostrils daily.  Dispense: 16 g; Refill: 6     Meds ordered this encounter  Medications   albuterol (VENTOLIN HFA) 108 (90 Base) MCG/ACT inhaler    Sig: Inhale 2 puffs into the lungs every 6 (six) hours as needed for wheezing or shortness of breath.    Dispense:  8 g    Refill:  0    Order Specific Question:   Supervising Provider    Answer:   Shelton Silvas   oseltamivir (TAMIFLU) 75 MG capsule     Sig: Take 1 capsule (75 mg total) by mouth 2 (two) times daily.    Dispense:  10 capsule    Refill:  0  Order Specific Question:   Supervising Provider    Answer:   Rochel Brome [id number]   predniSONE (DELTASONE) 20 MG tablet    Sig: Take 3 tablets (60 mg total) by mouth daily with breakfast for 3 days, THEN 2 tablets (40 mg total) daily with breakfast for 3 days, THEN 1 tablet (20 mg total) daily with breakfast for 3 days.    Dispense:  18 tablet    Refill:  0    Order Specific Question:   Supervising Provider    Answer:   COX, Lynder Parents   fluticasone (FLONASE) 50 MCG/ACT nasal spray    Sig: Place 2 sprays into both nostrils daily.    Dispense:  16 g    Refill:  6    Order Specific Question:   Supervising Provider    AnswerShelton Silvas    Orders Placed This Encounter  Procedures   POC COVID-19   Influenza A/B     Follow-up: Return if symptoms worsen or fail to improve.  An After Visit Summary was printed and given to the patient.  Yetta Flock [facility] [phone]

## 2023-03-31 ENCOUNTER — Ambulatory Visit (INDEPENDENT_AMBULATORY_CARE_PROVIDER_SITE_OTHER): Payer: Worker's Compensation | Admitting: Orthopaedic Surgery

## 2023-03-31 VITALS — Ht 74.0 in | Wt 198.0 lb

## 2023-03-31 DIAGNOSIS — M5126 Other intervertebral disc displacement, lumbar region: Secondary | ICD-10-CM

## 2023-04-02 NOTE — Progress Notes (Signed)
Office Visit Note   Patient: Brad Zuniga.           Date of Birth: 1971/05/20           MRN: [id number] Visit Date: 03/31/2023              Requested by: Marianne Sofia, PA-C [address] [place],  [place] [address] PCP: Marianne Sofia, PA-C   Assessment & Plan: Visit Diagnoses:  1. Protrusion of lumbar intervertebral disc     Plan: Patient got some relief short-term from epidural injection.  We reviewed MRI scan images today.  He has an appointment for his knee which is at another office.  Original injury was falling off a ladder as he was coming down the ladder.  His job was first Transport planner.  He remains out of work right now by another orthopedic office he is working on his knee.  Plan to recheck him in 2 months at this point no surgery is recommended for his back.  He still limping some with his knee and his back may get some improvement once his knee is doing better.  Follow-Up Instructions: Return in about 2 months (around 05/31/2023).   Orders:  No orders of the defined types were placed in this encounter.  No orders of the defined types were placed in this encounter.     Procedures: No procedures performed   Clinical Data: No additional findings.   Subjective: Chief Complaint  Patient presents with   Lower Back - Pain    HPI 52 year old male returns for ongoing problems with lumbar disc protrusion.  He also has some problems with his right knee he has been out of work and sees Dr. Charlann Boxer for this.  He states he has a plank in his knee and has a follow-up visit coming up shortly.  Patient's MRI 08/06/2022 showed stable.  Since 2022 with L5-S1 left paracentral disc protrusion moderately affecting the descending left S1 nerve root.  He does have transitional anatomy.  Review of Systems all systems update unchanged.   Objective: Vital Signs: Ht 6\' 2"  (1.88 m)   Wt 198 lb (89.8 kg)   BMI 25.42 kg/m   Physical Exam Constitutional:       Appearance: He is well-developed.  HENT:     Head: Normocephalic and atraumatic.     Right Ear: External ear normal.     Left Ear: External ear normal.  Eyes:     Pupils: Pupils are equal, round, and reactive to light.  Neck:     Thyroid: No thyromegaly.     Trachea: No tracheal deviation.  Cardiovascular:     Rate and Rhythm: Normal rate.  Pulmonary:     Effort: Pulmonary effort is normal.     Breath sounds: No wheezing.  Abdominal:     General: Bowel sounds are normal.     Palpations: Abdomen is soft.  Musculoskeletal:     Cervical back: Neck supple.  Skin:    General: Skin is warm and dry.     Capillary Refill: Capillary refill takes less than 2 seconds.  Neurological:     Mental Status: He is alert and oriented to person, place, and time.  Psychiatric:        Behavior: Behavior normal.        Thought Content: Thought content normal.        Judgment: Judgment normal.     Ortho Exam negative logroll hips anterior tib gastrocsoleus  is intact.  Patient is ambulating with a cane.  No rash over exposed skin.  Specialty Comments:  MRI LUMBAR SPINE WITHOUT CONTRAST   TECHNIQUE: Multiplanar, multisequence MR imaging of the lumbar spine was performed. No intravenous contrast was administered.   COMPARISON:  X-ray 03/26/2021   FINDINGS: Segmentation: Based on comparison images including chest x-ray from 07/25/2020 and CT abdomen pelvis from 09/14/2019, there are 12 rib-bearing thoracic type vertebral segments and 5 lumbar type vertebral segments with a near fully lumbarized transitional S1 segment and a well developed disc space at the S1-S2 level.   Alignment:  Physiologic.   Vertebrae:  No fracture, evidence of discitis, or bone lesion.   Conus medullaris and cauda equina: Conus extends to the L1 level. Conus and cauda equina appear normal.   Paraspinal and other soft tissues: Negative.   Disc levels:   T12-L1: No significant disc protrusion, foraminal  stenosis, or canal stenosis.   L1-L2: No significant disc protrusion, foraminal stenosis, or canal stenosis.   L2-L3: No significant disc protrusion, foraminal stenosis, or canal stenosis.   L3-L4: No significant disc protrusion, foraminal stenosis, or canal stenosis.   L4-L5: Mild annular disc bulge, slightly eccentric to the right resulting in mild right foraminal stenosis. No canal stenosis.   L5-S1: Left paracentral disc protrusion with annular fissure. Minimal facet arthropathy. There is moderate left and mild right subarticular recess stenosis without canal stenosis. No significant foraminal stenosis.   S1-S2: Shallow central disc protrusion without canal or foraminal stenosis.   IMPRESSION: 1. 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 2. Left paracentral disc protrusion at L5-S1 with annular fissure resulting in moderate left and mild right subarticular recess stenosis without canal stenosis. 3. Mild right foraminal stenosis at L4-L5. 4. No canal stenosis at any level.     Electronically Signed   By: Duanne Guess D.O.   On: 04/06/2021 16:51  Imaging: No results found.   PMFS History: Patient Active Problem List   Diagnosis Date Noted   Influenza B 09/18/2022   Bronchospasm 09/18/2022   Protrusion of lumbar intervertebral disc 05/07/2022   Lymphadenitis 04/15/2021   Temporal lobe epilepsy (HCC) 08/20/2020   GERD (gastroesophageal reflux disease)    Dyspnea 07/25/2020   Chest pain 07/25/2020   Cough 07/24/2020   Other fatigue 07/24/2020   Rectal bleeding 09/15/2019   Generalized abdominal pain 09/15/2019   Acute laryngopharyngitis 09/15/2019   Cerebral aneurysm without rupture 01/12/2018   Chronic daily headache 01/12/2018   Postural dizziness with presyncope 01/12/2018   [facility] spotted fever) 01/12/2018   Past Medical History:  Diagnosis Date   GERD (gastroesophageal  reflux disease)     Family History  Problem Relation Age of Onset   Heart attack Father    Diabetes Father    Hypertension Father    Deep vein thrombosis Father    Breast cancer Sister    Heart defect Sister    Thyroid disease Sister    Lung cancer Maternal Grandmother    Lung cancer Maternal Grandfather    Heart attack Paternal Grandmother    Lung cancer Paternal Grandfather     No past surgical history on file. 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

## 2023-05-14 ENCOUNTER — Telehealth: Payer: Self-pay | Admitting: Orthopaedic Surgery

## 2023-05-14 NOTE — Telephone Encounter (Signed)
Evette WC adjustor would like notes from OV with yates 9/17 faxed to [phone]

## 2023-05-18 ENCOUNTER — Telehealth: Payer: Self-pay | Admitting: Radiology

## 2023-05-18 NOTE — Telephone Encounter (Signed)
Dr. Paula Libra see message below from Work Comp and advise on work note and restrictions. Per your office notes, patient had been out of work for his knee which was being treated by another physician. We are seeing him for his back.   See below message from [facility] rep., asking about work restrictions. Thx Tisha   Good morning, Tisha   Thank you for the office visit notes for DOS 03/31/23. 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 Please forward a work note with restrictions.    Thank you,     Lina Sar  Metallurgist  Workers' Compensation  [employer] [place] Adjuster  Lic# [id number]  [address]  [place], [place] [address]  Phone: [phone]  Fax: [phone]  [email or web address]  [email or web address]

## 2023-06-30 ENCOUNTER — Ambulatory Visit: Payer: BLUE CROSS/BLUE SHIELD | Admitting: Orthopaedic Surgery

## 2024-02-01 ENCOUNTER — Telehealth: Payer: Self-pay | Admitting: Cardiology

## 2024-02-01 NOTE — Telephone Encounter (Signed)
 Patient c/o Palpitations: STAT if patient c/o lightheadedness, shortness of breath, or chest pain  How long have you had palpitations/irregular HR/ Afib? Are you having the symptoms now? Since Saturday   Are you currently experiencing lightheadedness, SOB or CP? No   Do you have a history of afib (atrial fibrillation) or irregular heart rhythm? No  Have you checked your BP or HR? (document readings if available): N/A  Are you experiencing any other symptoms? No

## 2024-02-01 NOTE — Telephone Encounter (Signed)
 Called patient back about message. Patient stated he has been feeling fluttering in his heart that takes his breath away that last for about a minute each time. Patient stated it started on Saturday while mowing the grass on the riding lawnmower. Then it has been happening off and on since then. Patient stated he had 12 episodes yesterday, so he has cut out sodas, and it has only happened 3 times today. Patient is concerned because his dad has CAD and grandmother died of heart attack. Patient denies any chest pain. Patient stated since his fall 3 years ago, he has not been as active. Patient did have a headache this morning, but took tylenol  that helped. Patient took his BP 159/92 HR 68. Patient is very stress due to insurance changes, and life in general. Will send message to Dr. Krystal for advisement. Patient scheduled to see Dr. Sheena in October since he is over due for an appointment.

## 2024-04-14 ENCOUNTER — Ambulatory Visit: Payer: Self-pay | Attending: Cardiology | Admitting: Cardiology

## 2024-05-16 ENCOUNTER — Encounter: Payer: Self-pay | Admitting: Radiology
